# Patient Record
Sex: Male | Born: 1985 | Race: Black or African American | Hispanic: No | Marital: Married | Smoking: Former smoker
Health system: Southern US, Community
[De-identification: ages and names within clinical notes are randomized; demographics above are authoritative.]

## PROBLEM LIST (undated history)

## (undated) DIAGNOSIS — D649 Anemia, unspecified: Secondary | ICD-10-CM

## (undated) DIAGNOSIS — R6510 Systemic inflammatory response syndrome (SIRS) of non-infectious origin without acute organ dysfunction: Secondary | ICD-10-CM

## (undated) DIAGNOSIS — I313 Pericardial effusion (noninflammatory): Secondary | ICD-10-CM

## (undated) HISTORY — DX: Pericardial effusion (noninflammatory): I31.3

## (undated) HISTORY — DX: Anemia, unspecified: D64.9

## (undated) HISTORY — DX: Systemic inflammatory response syndrome (sirs) of non-infectious origin without acute organ dysfunction: R65.10

---

## 2017-11-22 ENCOUNTER — Ambulatory Visit (INDEPENDENT_AMBULATORY_CARE_PROVIDER_SITE_OTHER): Payer: Self-pay

## 2017-11-22 ENCOUNTER — Encounter (HOSPITAL_COMMUNITY): Payer: Self-pay | Admitting: Emergency Medicine

## 2017-11-22 ENCOUNTER — Other Ambulatory Visit: Payer: Self-pay

## 2017-11-22 ENCOUNTER — Observation Stay (HOSPITAL_COMMUNITY)
Admission: EM | Admit: 2017-11-22 | Discharge: 2017-11-23 | Disposition: A | Discharge status: Home / Self Care | Payer: Self-pay | Attending: Internal Medicine | Admitting: Internal Medicine

## 2017-11-22 ENCOUNTER — Emergency Department (HOSPITAL_COMMUNITY): Payer: Self-pay

## 2017-11-22 ENCOUNTER — Ambulatory Visit (HOSPITAL_COMMUNITY)
Admission: EM | Admit: 2017-11-22 | Discharge: 2017-11-22 | Disposition: A | Discharge status: Home / Self Care | Payer: Self-pay | Attending: Family Medicine | Admitting: Family Medicine

## 2017-11-22 ENCOUNTER — Encounter (HOSPITAL_COMMUNITY): Payer: Self-pay

## 2017-11-22 DIAGNOSIS — D649 Anemia, unspecified: Secondary | ICD-10-CM | POA: Diagnosis present

## 2017-11-22 DIAGNOSIS — R509 Fever, unspecified: Principal | ICD-10-CM

## 2017-11-22 DIAGNOSIS — I313 Pericardial effusion (noninflammatory): Secondary | ICD-10-CM | POA: Insufficient documentation

## 2017-11-22 DIAGNOSIS — I3139 Other pericardial effusion (noninflammatory): Secondary | ICD-10-CM

## 2017-11-22 DIAGNOSIS — F17200 Nicotine dependence, unspecified, uncomplicated: Secondary | ICD-10-CM | POA: Insufficient documentation

## 2017-11-22 DIAGNOSIS — M791 Myalgia, unspecified site: Secondary | ICD-10-CM

## 2017-11-22 DIAGNOSIS — R6510 Systemic inflammatory response syndrome (SIRS) of non-infectious origin without acute organ dysfunction: Secondary | ICD-10-CM | POA: Diagnosis present

## 2017-11-22 DIAGNOSIS — R0789 Other chest pain: Secondary | ICD-10-CM

## 2017-11-22 DIAGNOSIS — I300 Acute nonspecific idiopathic pericarditis: Secondary | ICD-10-CM | POA: Insufficient documentation

## 2017-11-22 LAB — CBC
HCT: 34.9 % — ABNORMAL LOW (ref 39.0–52.0)
HCT: 33.7 % — ABNORMAL LOW (ref 39.0–52.0)
Hemoglobin: 11.7 g/dL — ABNORMAL LOW (ref 13.0–17.0)
Hemoglobin: 11.3 g/dL — ABNORMAL LOW (ref 13.0–17.0)
MCH: 30.2 pg (ref 26.0–34.0)
MCH: 30.1 pg (ref 26.0–34.0)
MCHC: 33.5 g/dL (ref 30.0–36.0)
MCHC: 33.5 g/dL (ref 30.0–36.0)
MCV: 90.2 fL (ref 78.0–100.0)
MCV: 89.9 fL (ref 78.0–100.0)
PLATELETS: 287 10*3/uL (ref 150–400)
PLATELETS: 291 10*3/uL (ref 150–400)
RBC: 3.87 MIL/uL — ABNORMAL LOW (ref 4.22–5.81)
RBC: 3.75 MIL/uL — ABNORMAL LOW (ref 4.22–5.81)
RDW: 13.2 % (ref 11.5–15.5)
RDW: 13.2 % (ref 11.5–15.5)
WBC: 16.4 10*3/uL — AB (ref 4.0–10.5)
WBC: 15.1 10*3/uL — ABNORMAL HIGH (ref 4.0–10.5)

## 2017-11-22 LAB — BASIC METABOLIC PANEL
Anion gap: 12 (ref 5–15)
BUN: 8 mg/dL (ref 6–20)
CALCIUM: 9 mg/dL (ref 8.9–10.3)
CO2: 23 mmol/L (ref 22–32)
CREATININE: 1.08 mg/dL (ref 0.61–1.24)
Chloride: 97 mmol/L — ABNORMAL LOW (ref 98–111)
GFR calc Af Amer: >60 mL/min (ref >60)
GLUCOSE: 94 mg/dL (ref 70–99)
Potassium: 4 mmol/L (ref 3.5–5.1)
Sodium: 132 mmol/L — ABNORMAL LOW (ref 135–145)

## 2017-11-22 LAB — I-STAT TROPONIN, ED: TROPONIN I, POC: 0.01 ng/mL (ref 0.00–0.08)

## 2017-11-22 LAB — INFLUENZA PANEL BY PCR (TYPE A & B)
INFLBPCR: NEGATIVE
Influenza A By PCR: NEGATIVE

## 2017-11-22 LAB — POCT URINALYSIS DIP (DEVICE)
Glucose, UA: NEGATIVE mg/dL
Ketones, ur: 15 mg/dL — AB
Leukocytes, UA: NEGATIVE
NITRITE: NEGATIVE
PH: 6 (ref 5.0–8.0)
Protein, ur: 100 mg/dL — AB
Specific Gravity, Urine: 1.025 (ref 1.005–1.030)
UROBILINOGEN UA: 4 mg/dL — AB (ref 0.0–1.0)

## 2017-11-22 LAB — I-STAT CG4 LACTIC ACID, ED: Lactic Acid, Venous: 1.39 mmol/L (ref 0.5–1.9)

## 2017-11-22 LAB — TROPONIN I: Troponin I: <0.03 ng/mL (ref <0.03)

## 2017-11-22 LAB — POCT RAPID STREP A: STREPTOCOCCUS, GROUP A SCREEN (DIRECT): NEGATIVE

## 2017-11-22 MED ORDER — ACETAMINOPHEN 325 MG PO TABS
ORAL_TABLET | ORAL | Status: AC
  Filled 2017-11-22: qty 3

## 2017-11-22 MED ORDER — COLCHICINE 0.6 MG PO TABS
1.2000 mg | ORAL_TABLET | Freq: Once | ORAL | Status: AC
  Administered 2017-11-23: 1.2 mg via ORAL
  Filled 2017-11-22: qty 2

## 2017-11-22 MED ORDER — ACETAMINOPHEN 325 MG PO TABS
975.0000 mg | ORAL_TABLET | Freq: Once | ORAL | Status: AC
  Administered 2017-11-22: 975 mg via ORAL

## 2017-11-22 MED ORDER — METHYLPREDNISOLONE SODIUM SUCC 125 MG IJ SOLR: 80.0000 mg | Freq: Once | INTRAMUSCULAR | Status: DC

## 2017-11-22 MED ORDER — SODIUM CHLORIDE 0.9 % IV BOLUS
1000.0000 mL | Freq: Once | INTRAVENOUS | Status: AC
  Administered 2017-11-22: 1000 mL via INTRAVENOUS

## 2017-11-22 MED ORDER — HYDROCODONE-ACETAMINOPHEN 5-325 MG PO TABS
2.0000 | ORAL_TABLET | Freq: Once | ORAL | Status: AC
  Administered 2017-11-22: 2 via ORAL
  Filled 2017-11-22: qty 2

## 2017-11-22 MED ORDER — IBUPROFEN 800 MG PO TABS
800.0000 mg | ORAL_TABLET | Freq: Once | ORAL | Status: AC
  Administered 2017-11-22: 800 mg via ORAL
  Filled 2017-11-22: qty 1

## 2017-11-22 MED ORDER — IOPAMIDOL (ISOVUE-370) INJECTION 76%
100.0000 mL | Freq: Once | INTRAVENOUS | Status: AC | PRN
  Administered 2017-11-22: 100 mL via INTRAVENOUS

## 2017-11-22 MED ORDER — IOPAMIDOL (ISOVUE-370) INJECTION 76%
INTRAVENOUS | Status: AC
  Filled 2017-11-22: qty 100

## 2017-11-22 MED ORDER — METHYLPREDNISOLONE SODIUM SUCC 125 MG IJ SOLR
INTRAMUSCULAR | Status: AC
  Filled 2017-11-22: qty 2

## 2017-11-22 NOTE — ED Triage Notes (Signed)
Pt may have food poisoning per wife.

## 2017-11-22 NOTE — Discharge Instructions (Addendum)
No cause of infection identified, WBC elevated   Please go to emergency room for further workup

## 2017-11-22 NOTE — ED Provider Notes (Addendum)
[facility]    CSN: [id number] Arrival date & time: 11/22/17  1303     History   Chief Complaint Chief Complaint  Patient presents with  . Fever    HPI Jon Doyle is a 32 y.o. male no significant past medical history presenting today for evaluation of fever.  Patient has had a fever since Monday, for the past 4 days.  He has had associated symptoms of chills and body aches.  No other prominent symptoms.  He has had some discomfort in his chest, but no significant cough, sore throat, congestion or rhinorrhea.  He has a slight discomfort in his throat.  Denies any nausea, vomiting or diarrhea.  Denies abdominal pain.  Denies urinary symptoms of dysuria, increased frequency or urgency.  Does have some neck discomfort, but denies difficulty moving neck.  Wife concerned about possible Listeria as patient had [employer] on Friday and ate leftovers on Saturday and his symptoms started the next day.  HPI  History reviewed. No pertinent past medical history.  There are no active problems to display for this patient.   History reviewed. No pertinent surgical history.     Home Medications    Prior to Admission medications   Not on File    Family History History reviewed. No pertinent family history.  Social History Social History   Tobacco Use  . Smoking status: Not on file  Substance Use Topics  . Alcohol use: Not on file  . Drug use: Not on file     Allergies   Patient has no known allergies.   Review of Systems Review of Systems  Constitutional: Positive for activity change, appetite change and fever. Negative for fatigue.  HENT: Negative for congestion, sinus pressure and sore throat.   Eyes: Negative for photophobia, pain and visual disturbance.  Respiratory: Negative for cough and shortness of breath.   Cardiovascular: Negative for chest pain.  Gastrointestinal: Negative for abdominal pain, nausea and vomiting.  Genitourinary: Negative for  decreased urine volume and hematuria.  Musculoskeletal: Positive for myalgias and neck pain. Negative for neck stiffness.  Neurological: Positive for headaches. Negative for dizziness, syncope, facial asymmetry, speech difficulty, weakness, light-headedness and numbness.     Physical Exam Triage Vital Signs ED Triage Vitals  Enc Vitals Group     BP 11/22/17 1321 124/84     Pulse Rate 11/22/17 1321 (!) 119     Resp 11/22/17 1321 20     Temp 11/22/17 1320 (!) 101 F (38.3 C)     Temp Source 11/22/17 1320 Oral     SpO2 11/22/17 1321 98 %     Weight --      Height --      Head Circumference --      Peak Flow --      Pain Score --      Pain Loc --      Pain Edu? --      Excl. in GC? --    No data found.  Updated Vital Signs BP 124/84 (BP Location: Right Arm)   Pulse (!) 119   Temp (!) 101 F (38.3 C) (Oral)   Resp 20   SpO2 98%   Visual Acuity Right Eye Distance:   Left Eye Distance:   Bilateral Distance:    Right Eye Near:   Left Eye Near:    Bilateral Near:     Physical Exam  Constitutional: He appears well-developed and well-nourished.  HENT:  Head: Normocephalic and  atraumatic.  Bilateral ears without tenderness to palpation of external auricle, tragus and mastoid, EAC's without erythema or swelling, TM's with good bony landmarks and cone of light. Non erythematous.  Oral mucosa pink and moist, mild tonsillar enlargement or exudate. Posterior pharynx patent and erythematous, no uvula deviation or swelling. Normal phonation.  Eyes: Pupils are equal, round, and reactive to light. Conjunctivae and EOM are normal.  Neck: Neck supple.  Full active range of motion of neck, negative Brudzinski and Kernig  Cardiovascular: Regular rhythm.  No murmur heard. Tachycardic  Pulmonary/Chest: Effort normal and breath sounds normal. No respiratory distress.  Breathing comfortably at rest, CTABL, no wheezing, rales or other adventitious sounds auscultated  Abdominal: Soft.  There is no tenderness.  Nontender to light deep palpation throughout all 4 quadrants and epigastrium  Musculoskeletal: He exhibits no edema.  Nontender to palpation along the cervical, thoracic and lumbar spine midline  Neurological: He is alert.  Skin: Skin is warm and dry.  Psychiatric: He has a normal mood and affect.  Nursing note and vitals reviewed.    UC Treatments / Results  Labs (all labs ordered are listed, but only abnormal results are displayed) Labs Reviewed  CULTURE, GROUP A STREP [facility])  CBC    EKG None  Radiology Dg Chest 2 View  Result Date: 11/22/2017 CLINICAL DATA:  Fever of unknown origin for 4 days, body aches, smoking history EXAM: CHEST - 2 VIEW COMPARISON:  None. FINDINGS: No active infiltrate or effusion is seen. Mediastinal and hilar contours are unremarkable. The heart is within upper limits of normal. No acute bony abnormality is seen. Left nipple piercing is present. IMPRESSION: No active cardiopulmonary disease. Electronically Signed   By: Dwyane Dee M.D.   On: 11/22/2017 13:56    Procedures Procedures (including critical care time)  Medications Ordered in UC Medications - No data to display  Initial Impression / Assessment and Plan / UC Course  I have reviewed the triage vital signs and the nursing notes.  Pertinent labs & imaging results that were available during my care of the patient were reviewed by me and considered in my medical decision making (see chart for details).     Patient with fever of unknown origin, immunocompetent.  Fever and tachycardic.  Chest x-ray negative.  Strep negative.  UA negative for signs of infection, did show signs of dehydration which correlates with color of urine and tachycardia.  Possible viral cause, but CBC showing white blood cells of 16.4.  Will have patient go to emergency room for further work-up of fever given elevation.  Patient and wife verbalized understanding.  Patient stable upon discharge, wife  accompanying patient to emergency room. Final Clinical Impressions(s) / UC Diagnoses   Final diagnoses:  None   Discharge Instructions   None    ED Prescriptions    None     Controlled Substance Prescriptions [place] Controlled Substance Registry consulted? Not Applicable   Lew Dawes, PA-C 11/22/17 1452    Lew Dawes, [place] 11/22/17 1459

## 2017-11-22 NOTE — ED Notes (Signed)
Bed: UC01 Expected date: 11/22/17 Expected time:  Means of arrival:  Comments: For APPTS

## 2017-11-22 NOTE — ED Triage Notes (Signed)
Pt presents with ongoing fever that is making him weak.

## 2017-11-22 NOTE — ED Triage Notes (Signed)
Pt presents with fever x 2 days with CP that radiates to bilat shoulders and back; [facility] for fever; pts WBC was 16.4, UA neg, Strep neg, CXR neg

## 2017-11-22 NOTE — ED Provider Notes (Signed)
[facility] EMERGENCY DEPARTMENT Provider Note   CSN: [id number] Arrival date & time: 11/22/17  1500     History   Chief Complaint Chief Complaint  Patient presents with  . Fever  . Chest Pain  . Back Pain    HPI Jon Doyle is a 32 y.o. male.  He has no significant past medical history.  He is complaining of fever since Monday to a T-max of 102.9.  It does not seem to be responsive to ibuprofen.  He is also complained of some upper chest discomfort that radiates into his bilateral shoulders and upper back.  This is worsened with any deep breath.  There is been no cough no runny nose no sore throat no earache no nausea no vomiting no diarrhea.  No urinary symptoms no rashes.  No headache or neck pain.  No sick contacts no recent travel.  His wife is with him and she is not sick with anything.  He has been seen at urgent care where they had a white count of 16- urinalysis strep negative chest x-ray negative and sent him here for further evaluation.  Patient denies any IV drug use.  The history is provided by the patient.  Fever   The current episode started more than 2 days ago. The problem occurs constantly. The problem has not changed since onset.The maximum temperature noted was 102 to 102.9 F. Associated symptoms include chest pain and muscle aches. Pertinent negatives include no diarrhea, no vomiting, no congestion, no headaches, no sore throat and no cough. He has tried ibuprofen for the symptoms. The treatment provided mild relief.  Chest Pain   Associated symptoms include back pain, diaphoresis and a fever. Pertinent negatives include no abdominal pain, no cough, no headaches, no nausea, no shortness of breath and no vomiting.  Back Pain   Associated symptoms include chest pain and a fever. Pertinent negatives include no headaches, no abdominal pain and no dysuria.    History reviewed. No pertinent past medical history.  There are no active problems to  display for this patient.   History reviewed. No pertinent surgical history.      Home Medications    Prior to Admission medications   Not on File    Family History History reviewed. No pertinent family history.  Social History Social History   Tobacco Use  . Smoking status: Current Every Day Smoker  . Tobacco comment: black and mild  Substance Use Topics  . Alcohol use: Yes    Comment: social   . Drug use: Not Currently    Types: Marijuana    Comment: occasionally     Allergies   Patient has no known allergies.   Review of Systems Review of Systems  Constitutional: Positive for appetite change, diaphoresis and fever. Negative for chills.  HENT: Negative for congestion and sore throat.   Eyes: Negative for visual disturbance.  Respiratory: Negative for cough and shortness of breath.   Cardiovascular: Positive for chest pain. Negative for leg swelling.  Gastrointestinal: Negative for abdominal pain, diarrhea, nausea and vomiting.  Genitourinary: Negative for dysuria and genital sores.  Musculoskeletal: Positive for back pain. Negative for joint swelling, neck pain and neck stiffness.  Skin: Negative for rash and wound.  Neurological: Negative for headaches.     Physical Exam Updated Vital Signs BP (!) 141/87   Pulse (!) 106   Temp (!) 100.5 F (38.1 C) (Oral)   Resp 14   Ht 6' (1.829 m)  Wt 99.8 kg   SpO2 100%   BMI 29.84 kg/m   Physical Exam  Constitutional: He is oriented to person, place, and time. He appears well-developed and well-nourished.  HENT:  Head: Normocephalic and atraumatic.  Right Ear: Tympanic membrane normal.  Left Ear: Tympanic membrane normal.  Mouth/Throat: Uvula is midline, oropharynx is clear and moist and mucous membranes are normal.  Eyes: Conjunctivae are normal.  Neck: Full passive range of motion without pain. Neck supple. No Brudzinski's sign and no Kernig's sign noted.  Cardiovascular: Regular rhythm. Tachycardia  present.  No murmur heard. Pulmonary/Chest: Effort normal and breath sounds normal. No respiratory distress.  Abdominal: Soft. There is no tenderness.  Musculoskeletal: He exhibits no edema, tenderness or deformity.  Neurological: He is alert and oriented to person, place, and time. He has normal strength.  Skin: Skin is warm and dry.  Psychiatric: He has a normal mood and affect.  Nursing note and vitals reviewed.    ED Treatments / Results  Labs (all labs ordered are listed, but only abnormal results are displayed) Labs Reviewed  BASIC METABOLIC PANEL - Abnormal; Notable for the following components:      Result Value   Sodium 132 (*)    Chloride 97 (*)    All other components within normal limits  CBC - Abnormal; Notable for the following components:   WBC 15.1 (*)    RBC 3.75 (*)    Hemoglobin 11.3 (*)    HCT 33.7 (*)    All other components within normal limits  BASIC METABOLIC PANEL - Abnormal; Notable for the following components:   Calcium 8.3 (*)    All other components within normal limits  HEPATIC FUNCTION PANEL - Abnormal; Notable for the following components:   Albumin 2.8 (*)    All other components within normal limits  CBC WITH DIFFERENTIAL/PLATELET - Abnormal; Notable for the following components:   WBC 11.4 (*)    RBC 3.25 (*)    Hemoglobin 9.9 (*)    HCT 29.6 (*)    Monocytes Absolute 1.9 (*)    All other components within normal limits  SEDIMENTATION RATE - Abnormal; Notable for the following components:   Sed Rate 90 (*)    All other components within normal limits  C-REACTIVE PROTEIN - Abnormal; Notable for the following components:   CRP 22.6 (*)    All other components within normal limits  IRON AND TIBC - Abnormal; Notable for the following components:   Iron 9 (*)    TIBC 206 (*)    Saturation Ratios 4 (*)    All other components within normal limits  FERRITIN - Abnormal; Notable for the following components:   Ferritin 368 (*)    All  other components within normal limits  RETICULOCYTES - Abnormal; Notable for the following components:   RBC. 3.25 (*)    All other components within normal limits  URINALYSIS, ROUTINE W REFLEX MICROSCOPIC - Abnormal; Notable for the following components:   Hgb urine dipstick SMALL (*)    Ketones, ur 20 (*)    All other components within normal limits  CULTURE, BLOOD (ROUTINE X 2)  CULTURE, BLOOD (ROUTINE X 2)  MRSA PCR SCREENING  TROPONIN I  INFLUENZA PANEL BY PCR (TYPE A & B)  TSH  TROPONIN I  CK  VITAMIN B12  FOLATE  HIV ANTIBODY (ROUTINE TESTING W REFLEX)  TROPONIN I  TROPONIN I  ANA  I-STAT TROPONIN, ED  I-STAT CG4 LACTIC ACID, ED  EKG EKG Interpretation  Date/Time:  Thursday November 22 2017 16:06:32 EDT Ventricular Rate:  103 PR Interval:  134 QRS Duration: 86 QT Interval:  322 QTC Calculation: 421 R Axis:   88 Text Interpretation:  Sinus tachycardia Nonspecific T wave abnormality Abnormal ECG no prior to compare with Confirmed by Meridee ScoreButler, Owens Hara [phone]([id number]) on 11/22/2017 6:36:59 PM   Radiology Dg Chest 2 View  Result Date: 11/22/2017 CLINICAL DATA:  Fever of unknown origin for 4 days, body aches, smoking history EXAM: CHEST - 2 VIEW COMPARISON:  None. FINDINGS: No active infiltrate or effusion is seen. Mediastinal and hilar contours are unremarkable. The heart is within upper limits of normal. No acute bony abnormality is seen. Left nipple piercing is present. IMPRESSION: No active cardiopulmonary disease. Electronically Signed   By: Dwyane DeePaul  Barry M.D.   On: 11/22/2017 13:56   Ct Angio Chest Pe W/cm &/or Wo Cm  Result Date: 11/22/2017 CLINICAL DATA:  Shortness of breath. Chest pain and fever. Symptoms for 4 days. EXAM: CT ANGIOGRAPHY CHEST WITH CONTRAST TECHNIQUE: Multidetector CT imaging of the chest was performed using the standard protocol during bolus administration of intravenous contrast. Multiplanar CT image reconstructions and MIPs were obtained to evaluate  the vascular anatomy. CONTRAST:  100mL ISOVUE-370 IOPAMIDOL (ISOVUE-370) INJECTION 76% COMPARISON:  Radiograph earlier this day. FINDINGS: Cardiovascular: There are no filling defects within the pulmonary arteries to suggest pulmonary embolus. No aortic dissection or aneurysm. Moderately large circumferential pericardial effusion. This measures up to 2.1 cm in depth adjacent to the left ventricle. Fluid density higher than simple fluid. Mediastinum/Nodes: Small upper mediastinal lymph nodes all subcentimeter. No hilar adenopathy. Thyroid gland is normal. Esophagus is decompressed. Lungs/Pleura: No consolidation, pleural fluid or pulmonary edema. The trachea and mainstem bronchi are patent. Trace apical emphysema. Upper Abdomen: No acute abnormality. Musculoskeletal: There are no acute or suspicious osseous abnormalities. Ill-defined sclerotic density within T9 vertebral body is nonspecific but likely incidental. Minimal broad-based dextroscoliotic curvature of spine. Review of the MIP images confirms the above findings. IMPRESSION: 1. Moderate to large circumferential pericardial effusion. 2. No pulmonary embolus. 3. Mild emphysema, age advanced. Emphysema (ICD10-J43.9). Electronically Signed   By: Narda RutherfordMelanie  Sanford M.D.   On: 11/22/2017 22:39    Procedures Procedures (including critical care time)  Medications Ordered in ED Medications  sodium chloride 0.9 % bolus 1,000 mL (has no administration in time range)     Initial Impression / Assessment and Plan / ED Course  I have reviewed the triage vital signs and the nursing notes.  Pertinent labs & imaging results that were available during my care of the patient were reviewed by me and considered in my medical decision making (see chart for details).  Clinical Course as of Nov 24 1023  Thu Nov 22, 2017  [id number] 32 year old male with fever for 4 days, and the only localizing complaint is some upper chest discomfort radiating through the back with deep  breath.  He has an unremarkable exam.  He is got a white count of 15,000 and Artie had a negative chest x-ray.  Of added on blood cultures and troponin and if influenza panel.  Giving some IV fluids.   [MB]  1901 Cxr 9/12 - EXAM: CHEST - 2 VIEW  COMPARISON:  None.  FINDINGS: No active infiltrate or effusion is seen. Mediastinal and hilar contours are unremarkable. The heart is within upper limits of normal. No acute bony abnormality is seen. Left nipple piercing is present.  IMPRESSION: No active cardiopulmonary disease.    [  MB]  2328 Discussed with cardiology on-call C [facility].  He recommends high-dose NSAIDs or consideration for colchicine 1.2 mg.  He feels patient should be admitted to the hospital to have a formal cardiac echo tomorrow.  He said he would be available for patient further discussion tonight if there is any concerns.  Discussed with medicine hospitalist Dr. Linton Rump who will evaluate the patient in the ED.   [MB]    Clinical Course User Index [MB] Terrilee Files, MD     Final Clinical Impressions(s) / ED Diagnoses   Final diagnoses:  FUO (fever of unknown origin)  Pericardial effusion    ED Discharge Orders    None       Terrilee Files, MD 11/23/17 1026

## 2017-11-23 ENCOUNTER — Encounter (HOSPITAL_COMMUNITY): Payer: Self-pay | Admitting: Internal Medicine

## 2017-11-23 ENCOUNTER — Observation Stay (HOSPITAL_BASED_OUTPATIENT_CLINIC_OR_DEPARTMENT_OTHER): Payer: Self-pay

## 2017-11-23 DIAGNOSIS — D649 Anemia, unspecified: Secondary | ICD-10-CM

## 2017-11-23 DIAGNOSIS — I3139 Other pericardial effusion (noninflammatory): Secondary | ICD-10-CM | POA: Diagnosis present

## 2017-11-23 DIAGNOSIS — R6510 Systemic inflammatory response syndrome (SIRS) of non-infectious origin without acute organ dysfunction: Secondary | ICD-10-CM | POA: Diagnosis present

## 2017-11-23 DIAGNOSIS — I313 Pericardial effusion (noninflammatory): Secondary | ICD-10-CM

## 2017-11-23 HISTORY — DX: Other pericardial effusion (noninflammatory): I31.39

## 2017-11-23 HISTORY — DX: Anemia, unspecified: D64.9

## 2017-11-23 HISTORY — DX: Pericardial effusion (noninflammatory): I31.3

## 2017-11-23 HISTORY — DX: Systemic inflammatory response syndrome (sirs) of non-infectious origin without acute organ dysfunction: R65.10

## 2017-11-23 LAB — FERRITIN: FERRITIN: 368 ng/mL — AB (ref 24–336)

## 2017-11-23 LAB — CBC WITH DIFFERENTIAL/PLATELET
Abs Immature Granulocytes: 0 10*3/uL (ref 0.0–0.1)
BASOS ABS: 0 10*3/uL (ref 0.0–0.1)
BASOS PCT: 0 %
EOS PCT: 1 %
Eosinophils Absolute: 0.1 10*3/uL (ref 0.0–0.7)
HCT: 29.6 % — ABNORMAL LOW (ref 39.0–52.0)
Hemoglobin: 9.9 g/dL — ABNORMAL LOW (ref 13.0–17.0)
Immature Granulocytes: 0 %
Lymphocytes Relative: 20 %
Lymphs Abs: 2.2 10*3/uL (ref 0.7–4.0)
MCH: 30.5 pg (ref 26.0–34.0)
MCHC: 33.4 g/dL (ref 30.0–36.0)
MCV: 91.1 fL (ref 78.0–100.0)
MONO ABS: 1.9 10*3/uL — AB (ref 0.1–1.0)
Monocytes Relative: 17 %
Neutro Abs: 7 10*3/uL (ref 1.7–7.7)
Neutrophils Relative %: 62 %
PLATELETS: 261 10*3/uL (ref 150–400)
RBC: 3.25 MIL/uL — AB (ref 4.22–5.81)
RDW: 13.3 % (ref 11.5–15.5)
WBC: 11.4 10*3/uL — AB (ref 4.0–10.5)

## 2017-11-23 LAB — URINALYSIS, ROUTINE W REFLEX MICROSCOPIC
BACTERIA UA: NONE SEEN
Bilirubin Urine: NEGATIVE
GLUCOSE, UA: NEGATIVE mg/dL
Ketones, ur: 20 mg/dL — AB
Leukocytes, UA: NEGATIVE
Nitrite: NEGATIVE
PROTEIN: NEGATIVE mg/dL
SPECIFIC GRAVITY, URINE: 1.016 (ref 1.005–1.030)
pH: 6 (ref 5.0–8.0)

## 2017-11-23 LAB — HEPATIC FUNCTION PANEL
ALT: 29 U/L (ref 0–44)
AST: 20 U/L (ref 15–41)
Albumin: 2.8 g/dL — ABNORMAL LOW (ref 3.5–5.0)
Alkaline Phosphatase: 78 U/L (ref 38–126)
BILIRUBIN DIRECT: 0.2 mg/dL (ref 0.0–0.2)
BILIRUBIN INDIRECT: 0.4 mg/dL (ref 0.3–0.9)
TOTAL PROTEIN: 6.5 g/dL (ref 6.5–8.1)
Total Bilirubin: 0.6 mg/dL (ref 0.3–1.2)

## 2017-11-23 LAB — IRON AND TIBC
Iron: 9 ug/dL — ABNORMAL LOW (ref 45–182)
SATURATION RATIOS: 4 % — AB (ref 17.9–39.5)
TIBC: 206 ug/dL — ABNORMAL LOW (ref 250–450)
UIBC: 197 ug/dL

## 2017-11-23 LAB — BASIC METABOLIC PANEL
Anion gap: 9 (ref 5–15)
BUN: 7 mg/dL (ref 6–20)
CHLORIDE: 104 mmol/L (ref 98–111)
CO2: 23 mmol/L (ref 22–32)
Calcium: 8.3 mg/dL — ABNORMAL LOW (ref 8.9–10.3)
Creatinine, Ser: 1.1 mg/dL (ref 0.61–1.24)
GFR calc Af Amer: >60 mL/min (ref >60)
GLUCOSE: 91 mg/dL (ref 70–99)
POTASSIUM: 3.9 mmol/L (ref 3.5–5.1)
Sodium: 136 mmol/L (ref 135–145)

## 2017-11-23 LAB — TSH: TSH: 1.504 u[IU]/mL (ref 0.350–4.500)

## 2017-11-23 LAB — TROPONIN I: Troponin I: <0.03 ng/mL (ref <0.03)

## 2017-11-23 LAB — HIV ANTIBODY (ROUTINE TESTING W REFLEX): HIV SCREEN 4TH GENERATION: NONREACTIVE

## 2017-11-23 LAB — RETICULOCYTES
RBC.: 3.25 MIL/uL — AB (ref 4.22–5.81)
RETIC CT PCT: 0.6 % (ref 0.4–3.1)
Retic Count, Absolute: 19.5 10*3/uL (ref 19.0–186.0)

## 2017-11-23 LAB — FOLATE: Folate: 10.2 ng/mL (ref >5.9)

## 2017-11-23 LAB — CK: Total CK: 58 U/L (ref 49–397)

## 2017-11-23 LAB — ECHOCARDIOGRAM COMPLETE
Height: 72 in
Weight: 3612.02 oz

## 2017-11-23 LAB — MRSA PCR SCREENING: MRSA by PCR: NEGATIVE

## 2017-11-23 LAB — VITAMIN B12: Vitamin B-12: 255 pg/mL (ref 180–914)

## 2017-11-23 LAB — C-REACTIVE PROTEIN: CRP: 22.6 mg/dL — ABNORMAL HIGH (ref <1.0)

## 2017-11-23 LAB — SEDIMENTATION RATE: SED RATE: 90 mm/h — AB (ref 0–16)

## 2017-11-23 MED ORDER — COLCHICINE 0.6 MG PO TABS
0.6000 mg | ORAL_TABLET | Freq: Every day | ORAL | Status: DC
  Administered 2017-11-23: 0.6 mg via ORAL
  Filled 2017-11-23: qty 1

## 2017-11-23 MED ORDER — PANTOPRAZOLE SODIUM 40 MG PO TBEC
40.0000 mg | DELAYED_RELEASE_TABLET | Freq: Every day | ORAL | Status: DC
  Administered 2017-11-23: 40 mg via ORAL
  Filled 2017-11-23: qty 1

## 2017-11-23 MED ORDER — ACETAMINOPHEN 325 MG PO TABS: 650.0000 mg | ORAL_TABLET | Freq: Four times a day (QID) | ORAL | Status: DC | PRN

## 2017-11-23 MED ORDER — PANTOPRAZOLE SODIUM 40 MG PO TBEC: 40.0000 mg | DELAYED_RELEASE_TABLET | Freq: Every day | ORAL | 0 refills | Status: DC

## 2017-11-23 MED ORDER — HYDROCODONE-ACETAMINOPHEN 5-325 MG PO TABS: 1.0000 | ORAL_TABLET | ORAL | 0 refills | Status: DC | PRN

## 2017-11-23 MED ORDER — SODIUM CHLORIDE 0.9 % IV SOLN
INTRAVENOUS | Status: DC
  Administered 2017-11-23 (×2): via INTRAVENOUS

## 2017-11-23 MED ORDER — ONDANSETRON HCL 4 MG/2ML IJ SOLN: 4.0000 mg | Freq: Four times a day (QID) | INTRAMUSCULAR | Status: DC | PRN

## 2017-11-23 MED ORDER — IBUPROFEN 800 MG PO TABS: 800.0000 mg | ORAL_TABLET | Freq: Three times a day (TID) | ORAL | 0 refills | Status: DC

## 2017-11-23 MED ORDER — TRAMADOL HCL 50 MG PO TABS: 50.0000 mg | ORAL_TABLET | Freq: Four times a day (QID) | ORAL | Status: DC | PRN

## 2017-11-23 MED ORDER — HYDROCODONE-ACETAMINOPHEN 5-325 MG PO TABS: 2.0000 | ORAL_TABLET | ORAL | Status: DC | PRN

## 2017-11-23 MED ORDER — ONDANSETRON HCL 4 MG PO TABS: 4.0000 mg | ORAL_TABLET | Freq: Four times a day (QID) | ORAL | Status: DC | PRN

## 2017-11-23 MED ORDER — ACETAMINOPHEN 650 MG RE SUPP: 650.0000 mg | Freq: Four times a day (QID) | RECTAL | Status: DC | PRN

## 2017-11-23 MED ORDER — ACETAMINOPHEN 325 MG PO TABS
650.0000 mg | ORAL_TABLET | Freq: Four times a day (QID) | ORAL | Status: DC | PRN
  Administered 2017-11-23: 650 mg via ORAL
  Filled 2017-11-23: qty 2

## 2017-11-23 MED ORDER — IBUPROFEN 400 MG PO TABS
800.0000 mg | ORAL_TABLET | Freq: Three times a day (TID) | ORAL | Status: DC
  Administered 2017-11-23 (×2): 800 mg via ORAL
  Filled 2017-11-23 (×2): qty 2

## 2017-11-23 MED ORDER — COLCHICINE 0.6 MG PO TABS: 0.6000 mg | ORAL_TABLET | Freq: Two times a day (BID) | ORAL | 2 refills | Status: AC

## 2017-11-23 NOTE — Discharge Summary (Signed)
DISCHARGE SUMMARY  Jon Doyle  MR#: [id number]  DOB:[birth year]  Date of Admission: 11/22/2017 Date of Discharge: 11/23/2017  Attending Physician:Jeffrey Silvestre Gunner, MD  Patient's NWG:NFAOZHY, No Pcp Per  Consults:  [facility]   Disposition: D/C home   Follow-up Appts: Follow-up Information    Berton Bon, NP Follow up on 12/04/2017.   Specialty:  Nurse Practitioner Why:  Please arrive 15 minutes early for your 9:00am post-hospital cardiology follow-up appointment Contact information: [address] [place] [address] [phone]        Primary Care Physician of your choice Follow up.   Why:  Obtain a PCP to follow up on your anemia in 1-2 months.           Tests Needing Follow-up: -Assess for smoking cessation -Assess for resolution of pericardial effusion/pericarditis  -Re-evaluation of anemia once acute/subacute infection is resolved fully   Discharge Diagnoses: SIRS Large pericardial effusion - Idiopathic pericarditis  Normocytic normochromic anemia Tobacco abuse Possible Trace Apical Emphysema  Initial presentation: 32 y.o.malewithhistory of tobacco abuse who presented to the ER with fever and chills for 4-5 days w/ associated generalized body aches and pleuritic chest pain.   In the ER CT angio of the chest which showed a large pericardial effusion. Patient's BP was normal with no tachycardia. Cardiology was called and advised a 2D echo and NSAIDs + colchicine.   Hospital Course: The patient was admitted to the acute units with clinical and the above noted testing results consistent with pericarditis with a pericardial effusion.  There was no clinical evidence of tamponade.  Transthoracic echocardiogram was accomplished.  Cardiology consultation was accomplished.  Fortunately the patient's TTE did not reveal a significant sized effusion and confirmed no tamponade/pre-tamponade physiology.  The patient was already improving  with scheduled high-dose nonsteroidal and colchicine.  The exact cause of his pericarditis is not entirely clear but there were some symptoms suggestive of a possible low-grade preceding viral illness.  He has been cleared for discharge to continue 2 weeks of nonsteroidal therapy as well as 3 months of colchicine therapy.  He is to follow-up with cardiology for reevaluation.  During this hospital stay the patient was also found to be anemic.  His anemia was normochromic normocytic.  An anemia panel was most consistent with an anemia of chronic disease with iron deficiency.  Of note his ferritin was elevated, related to his acute pericarditis likely.  This may simply be related to a subacute/smoldering infection but warrants further evaluation in the future.  Reassessment of his anemia/hemoglobin is indicated at such time that his pericarditis is fully resolved.  Additionally the patient was found to have early findings consistent with apical emphysema on the CT Angie of the chest.  He admitted to habitual tobacco abuse.  He has been counseled as to the results of his CT scan and the absolute need to discontinue tobacco abuse to prevent progression to chronic symptomatic lung disease.  Allergies as of 11/23/2017   No Known Allergies     Medication List    TAKE these medications   acetaminophen 325 MG tablet Commonly known as:  TYLENOL Take 2 tablets (650 mg total) by mouth every 6 (six) hours as needed for mild pain (or Fever >/= 101).   colchicine 0.6 MG tablet Take 1 tablet (0.6 mg total) by mouth 2 (two) times daily.   HYDROcodone-acetaminophen 5-325 MG tablet Commonly known as:  NORCO/VICODIN Take 1 tablet by mouth every 4 (four) hours as needed  for severe pain.   ibuprofen 800 MG tablet Commonly known as:  ADVIL,MOTRIN Take 1 tablet (800 mg total) by mouth 3 (three) times daily with meals. What changed:    medication strength  how much to take  when to take this  reasons to take  this   loratadine 10 MG tablet Commonly known as:  CLARITIN Take 10 mg by mouth daily as needed (for cold-like symptoms or allergies).   NYQUIL MULTI-SYMPTOM PO Take 30 mLs by mouth every 6 (six) hours as needed (for fever or cold-like synptoms).   pantoprazole 40 MG tablet Commonly known as:  PROTONIX Take 1 tablet (40 mg total) by mouth daily at 12 noon. Start taking on:  11/24/2017       Day of Discharge BP 116/78   Pulse 82   Temp 98.4 F (36.9 C) (Oral)   Resp 16   Ht 6' (1.829 m)   Wt 102.4 kg   SpO2 100%   BMI 30.62 kg/m   Physical Exam: General: No acute respiratory distress Lungs: Clear to auscultation bilaterally without wheezes or crackles Cardiovascular: Regular rate and rhythm - prominent pericardial friction rub  Abdomen: Nontender, nondistended, soft, bowel sounds positive, no rebound, no ascites, no appreciable mass Extremities: No significant cyanosis, clubbing, or edema bilateral lower extremities  Basic Metabolic Panel: Recent Labs  Lab 11/22/17 1615 11/23/17 0312  NA 132* 136  K 4.0 3.9  CL 97* 104  CO2 23 23  GLUCOSE 94 91  BUN 8 7  CREATININE 1.08 1.10  CALCIUM 9.0 8.3*    Liver Function Tests: Recent Labs  Lab 11/23/17 0312  AST 20  ALT 29  ALKPHOS 78  BILITOT 0.6  PROT 6.5  ALBUMIN 2.8*    CBC: Recent Labs  Lab 11/22/17 1351 11/22/17 1615 11/23/17 0312  WBC 16.4* 15.1* 11.4*  NEUTROABS  --   --  7.0  HGB 11.7* 11.3* 9.9*  HCT 34.9* 33.7* 29.6*  MCV 90.2 89.9 91.1  PLT 287 291 261    Cardiac Enzymes: Recent Labs  Lab 11/22/17 1902 11/23/17 0312 11/23/17 0910  CKTOTAL  --  58  --   TROPONINI <0.03 <0.03 <0.03    Recent Results (from the past 240 hour(s))  Culture, group A strep (throat)     Status: None (Preliminary result)   Collection Time: 11/22/17  1:41 PM  Result Value Ref Range Status   Specimen Description THROAT  Final   Special Requests NONE  Final   Culture   Final    CULTURE REINCUBATED FOR  BETTER GROWTH Performed at [facility], [address], [place], [place] [address]    Report Status PENDING  Incomplete  Culture, blood (routine x 2)     Status: None (Preliminary result)   Collection Time: 11/22/17  7:01 PM  Result Value Ref Range Status   Specimen Description BLOOD RIGHT ANTECUBITAL  Final   Special Requests   Final    BOTTLES DRAWN AEROBIC AND ANAEROBIC Blood Culture adequate volume   Culture   Final    NO GROWTH < 24 HOURS Performed at [facility], [address] [place], [place] [address]    Report Status PENDING  Incomplete  Culture, blood (routine x 2)     Status: None (Preliminary result)   Collection Time: 11/22/17  7:21 PM  Result Value Ref Range Status   Specimen Description BLOOD LEFT ANTECUBITAL  Final   Special Requests   Final  BOTTLES DRAWN AEROBIC AND ANAEROBIC Blood Culture results may not be optimal due to an excessive volume of blood received in culture bottles   Culture   Final    NO GROWTH < 24 HOURS Performed at [facility], [address] [place], [place] [address]    Report Status PENDING  Incomplete  MRSA PCR Screening     Status: None   Collection Time: 11/23/17  1:44 AM  Result Value Ref Range Status   MRSA by PCR NEGATIVE NEGATIVE Final    Comment:        The GeneXpert MRSA Assay (FDA approved for NASAL specimens only), is one component of a comprehensive MRSA colonization surveillance program. It is not intended to diagnose MRSA infection nor to guide or monitor treatment for MRSA infections. Performed at [facility], [address] [place], [place] [address]      Time spent in discharge (includes decision making & examination of pt): 30 minutes  11/23/2017, 4:08 PM   Lonia BloodJeffrey T. McClung, MD [facility] Office  [phone] Pager [phone]  On-Call/Text Page:      [email or web address]      password [email or web address]

## 2017-11-23 NOTE — Care Management Note (Signed)
Case Management Note Donn PieriniKristi Sherel Fennell RN, BSN Unit 4E- RN Care Coordinator  [phone]  Patient Details  Name: Maryruth EveJonathan Prettyman MRN: [id number] Date of Birth: 09/30/1985  Subjective/Objective:    Pt presented with SIRS                Action/Plan: PTA pt lived at home with wife, independent with ADLs. Notified by MD that pt would transition home today and would need assistance with medications and PCP needs as pt is new to area. Spoke with pt and wife at bedside- moved here from [place]- no PCP yet and no insurance (pt started new job and no insurance until Oct. 1). CM will assist pt with medications via MATCH for discharge. Pt/wife also familiar with Goodrx if needed prior to insurance starting. MATCH letter provided at bedside along with list of pharmacies to use. Program explained including one time use within 12 mo and copay cost per script of $3.00. Pt and wife voiced appreciation for assistance. Info also provided for low income clinics for PCP needs along with [facility] # for when pt gets his insurance info for PCP needs in this area. Pt and wife to f/u in finding a local PCP.   Expected Discharge Date:  11/23/17               Expected Discharge Plan:  Home/Self Care  In-House Referral:  NA  Discharge planning Services  CM Consult, Medication Assistance, MATCH Program, Indigent Health Clinic  Post Acute Care Choice:  NA Choice offered to:  NA  DME Arranged:    DME Agency:     HH Arranged:    HH Agency:     Status of Service:  Completed, signed off  If discussed at [employer] Length of Stay Meetings, dates discussed:    Discharge Disposition: home/self care   Additional Comments:  Darrold SpanWebster, Rylei Codispoti Hall, RN 11/23/2017, 4:44 PM

## 2017-11-23 NOTE — Progress Notes (Signed)
2D Echocardiogram has been performed.  Pieter PartridgeBrooke S Rechy Bost 11/23/2017, 3:12 PM

## 2017-11-23 NOTE — H&P (Signed)
History and Physical    Jon Doyle ZOX:[id number] DOB: 1986/01/17 DOA: 11/22/2017  PCP: Patient, No Pcp Per  Patient coming from: Home.  Chief Complaint: Fever chills.  HPI: Jon Doyle is a 32 y.o. male with history of tobacco abuse presents to the ER with complaint of fever and chills over the last 4 to 5 days.  Patient has been having generalized body ache with some pleuritic type of chest pain.  Denies any productive cough nausea vomiting abdominal pain diarrhea dysuria.  Has not had any recent travel or sick contacts.  Has been taking over-the-counter medications for the flulike symptoms.  Patient had gone to urgent care center earlier yesterday and over the had strep throat and UA which were unremarkable and since patient had fever and leukocytosis and referred to the ER.  ED Course: In the ER patient had some chest pain concerning and underwent CT angiogram of the chest which showed large pericardial effusion.  Patient blood pressure was normal no tachycardia.  On-call cardiologist was consulted and at this time advised getting 2D echo and starting patient on NSAIDs and colchicine.  Blood cultures were obtained.  And patient admitted for further observation.  In addition patient's blood work show normocytic normochromic anemia but patient denies receiving any blood in the stool.  Review of Systems: As per HPI, rest all negative.   History reviewed. No pertinent past medical history.  History reviewed. No pertinent surgical history.   reports that he has been smoking. He has never used smokeless tobacco. He reports that he drinks alcohol. He reports that he has current or past drug history. Drug: Marijuana.  No Known Allergies  History reviewed. No pertinent family history.  Prior to Admission medications   Medication Sig Start Date End Date Taking? Authorizing Provider  ibuprofen (ADVIL,MOTRIN) 200 MG tablet Take 400 mg by mouth every 6 (six) hours as needed (for fever).    Yes [provider]  loratadine (CLARITIN) 10 MG tablet Take 10 mg by mouth daily as needed (for cold-like symptoms or allergies).   Yes [provider]  Pseudoeph-Doxylamine-DM-APAP (NYQUIL MULTI-SYMPTOM PO) Take 30 mLs by mouth every 6 (six) hours as needed (for fever or cold-like synptoms).    Yes [provider]    Physical Exam: Vitals:   11/23/17 0015 11/23/17 0030 11/23/17 0045 11/23/17 0111  BP:    113/74  Pulse: 89 87 85 84  Resp: 16 16 16 14   Temp:    98.4 F (36.9 C)  TempSrc:    Oral  SpO2: 95% 97% 97% 98%  Weight:    102.4 kg  Height:    6' (1.829 m)      Constitutional: Moderately built and nourished. Vitals:   11/23/17 0015 11/23/17 0030 11/23/17 0045 11/23/17 0111  BP:    113/74  Pulse: 89 87 85 84  Resp: 16 16 16 14   Temp:    98.4 F (36.9 C)  TempSrc:    Oral  SpO2: 95% 97% 97% 98%  Weight:    102.4 kg  Height:    6' (1.829 m)   Eyes: Anicteric no pallor. ENMT: No discharge from the ears eyes nose or mouth. Neck: No mass felt.  No neck rigidity. Respiratory: No rhonchi or crepitations. Cardiovascular: S1-S2 heard no murmurs appreciated. Abdomen: Soft nontender bowel sounds present. Musculoskeletal: No edema.  No joint effusion. Skin: No rash.  Skin appears warm. Neurologic: Alert awake oriented to time place and person.  Moves all extremities.  Psychiatric: Appears normal per normal affect.   Labs on Admission: I have personally reviewed following labs and imaging studies  CBC: Recent Labs  Lab 11/22/17 1351 11/22/17 1615  WBC 16.4* 15.1*  HGB 11.7* 11.3*  HCT 34.9* 33.7*  MCV 90.2 89.9  PLT 287 291   Basic Metabolic Panel: Recent Labs  Lab 11/22/17 1615  NA 132*  K 4.0  CL 97*  CO2 23  GLUCOSE 94  BUN 8  CREATININE 1.08  CALCIUM 9.0   GFR: Estimated Creatinine Clearance: 120.4 mL/min (by C-G formula based on SCr of 1.08 mg/dL). Liver Function Tests: No results for input(s): AST, ALT, ALKPHOS,  BILITOT, PROT, ALBUMIN in the last 168 hours. No results for input(s): LIPASE, AMYLASE in the last 168 hours. No results for input(s): AMMONIA in the last 168 hours. Coagulation Profile: No results for input(s): INR, PROTIME in the last 168 hours. Cardiac Enzymes: Recent Labs  Lab 11/22/17 1902  TROPONINI <0.03   BNP (last 3 results) No results for input(s): PROBNP in the last 8760 hours. HbA1C: No results for input(s): HGBA1C in the last 72 hours. CBG: No results for input(s): GLUCAP in the last 168 hours. Lipid Profile: No results for input(s): CHOL, HDL, LDLCALC, TRIG, CHOLHDL, LDLDIRECT in the last 72 hours. Thyroid Function Tests: No results for input(s): TSH, T4TOTAL, FREET4, T3FREE, THYROIDAB in the last 72 hours. Anemia Panel: No results for input(s): VITAMINB12, FOLATE, FERRITIN, TIBC, IRON, RETICCTPCT in the last 72 hours. Urine analysis:    Component Value Date/Time   LABSPEC 1.025 11/22/2017 1414   PHURINE 6.0 11/22/2017 1414   GLUCOSEU NEGATIVE 11/22/2017 1414   HGBUR TRACE (A) 11/22/2017 1414   BILIRUBINUR MODERATE (A) 11/22/2017 1414   KETONESUR 15 (A) 11/22/2017 1414   PROTEINUR 100 (A) 11/22/2017 1414   UROBILINOGEN 4.0 (H) 11/22/2017 1414   NITRITE NEGATIVE 11/22/2017 1414   LEUKOCYTESUR NEGATIVE 11/22/2017 1414   Sepsis Labs: @LABRCNTIP (procalcitonin:4,lacticidven:4) )No results found for this or any previous visit (from the past 240 hour(s)).   Radiological Exams on Admission: Dg Chest 2 View  Result Date: 11/22/2017 CLINICAL DATA:  Fever of unknown origin for 4 days, body aches, smoking history EXAM: CHEST - 2 VIEW COMPARISON:  None. FINDINGS: No active infiltrate or effusion is seen. Mediastinal and hilar contours are unremarkable. The heart is within upper limits of normal. No acute bony abnormality is seen. Left nipple piercing is present. IMPRESSION: No active cardiopulmonary disease. Electronically Signed   By: Dwyane Dee M.D.   On: 11/22/2017  13:56   Ct Angio Chest Pe W/cm &/or Wo Cm  Result Date: 11/22/2017 CLINICAL DATA:  Shortness of breath. Chest pain and fever. Symptoms for 4 days. EXAM: CT ANGIOGRAPHY CHEST WITH CONTRAST TECHNIQUE: Multidetector CT imaging of the chest was performed using the standard protocol during bolus administration of intravenous contrast. Multiplanar CT image reconstructions and MIPs were obtained to evaluate the vascular anatomy. CONTRAST:  ISOVUE-370 IOPAMIDOL (ISOVUE-370) INJECTION 76% COMPARISON:  Radiograph earlier this day. FINDINGS: Cardiovascular: There are no filling defects within the pulmonary arteries to suggest pulmonary embolus. No aortic dissection or aneurysm. Moderately large circumferential pericardial effusion. This measures up to 2.1 cm in depth adjacent to the left ventricle. Fluid density higher than simple fluid. Mediastinum/Nodes: Small upper mediastinal lymph nodes all subcentimeter. No hilar adenopathy. Thyroid gland is normal. Esophagus is decompressed. Lungs/Pleura: No consolidation, pleural fluid or pulmonary edema. The trachea and mainstem bronchi are patent. Trace apical emphysema. Upper Abdomen: No acute  abnormality. Musculoskeletal: There are no acute or suspicious osseous abnormalities. Ill-defined sclerotic density within T9 vertebral body is nonspecific but likely incidental. Minimal broad-based dextroscoliotic curvature of spine. Review of the MIP images confirms the above findings. IMPRESSION: 1. Moderate to large circumferential pericardial effusion. 2. No pulmonary embolus. 3. Mild emphysema, age advanced. Emphysema (ICD10-J43.9). Electronically Signed   By: Narda RutherfordMelanie  Sanford M.D.   On: 11/22/2017 22:39    EKG: Independently reviewed.  Sinus tachycardia.  Assessment/Plan Principal Problem:   SIRS (systemic inflammatory response syndrome) (HCC) Active Problems:   Pericardial effusion   Normocytic anemia    1. SIRS with large pericardial effusion hemodynamically  stable no clinical signs of tamponade at this time -cardiologist has recommended to start patient on NSAIDs and colchicine which has been started and discussed with pharmacy for further dosing.  Also keep patient on Protonix.  Will check blood cultures HIV status ANA sed rate CRP.  Check 2D echo get formal cardiology consult in the morning. 2. Normocytic normochromic anemia -check anemia panel follow CBC. 3. Emphysema seen in the CT scan.  Smoking cessation counseling requested.  Presently not wheezing.   DVT prophylaxis: SCDs since patient has large pericardial effusion. Code Status: Full code. Family Communication: Discussed with patient's wife. Disposition Plan: Home. Consults called: Cardiology. Admission status: Observation.   Eduard ClosArshad N Nishika Parkhurst MD [facility] Pager [phone].  If 7PM-7AM, please contact night-coverage [email or web address] Password [email or web address]  11/23/2017, 2:20 AM

## 2017-11-23 NOTE — Consult Note (Addendum)
Cardiology Consultation:   Patient ID: Jon Doyle; [id number]; Sep 26, 1985   Admit date: 11/22/2017 Date of Consult: 11/23/2017  Primary Care Provider: Patient, No Pcp Per Primary Cardiologist: New to [place] Primary Electrophysiologist:  None   Patient Profile:   Jon Doyle is a 31 y.o. male with a PMH of tobacco abuse, who is being seen today for the evaluation of a pericardial effusion at the request of Dr. Thereasa Solo.  History of Present Illness:   Jon Doyle was in his usual state of health until Monday 11/19/17 when he began experiencing fevers and myalgias. He reported some pleuritic chest pain which was worse with laying down and improved with sitting upright. He states the pain would worsening with yawning, sneezing, or hiccupping. Prior to this he does not recall any specific URI symptoms but wife reports that he complained of a sore throat the week before fever onset. He had a decreased appetite and energy level since onset of fevers. He denied dysuria, nausea, vomiting, or diarrhea.   He denies prior cardiac history. No family history of heart disease in mother/father who are alive and healthy. He does reports aunts/uncles who have had MI's. No family history of autoimmune disease that he knows of. He does smoke, about 4-5 black & mild's per day. He has never tried to quit in the past but is not opposed to quitting.   He is feeling much better at the time of this evaluation. He is able to lay in the bed comfortably with pain. Fevers have stopped. He feels like his appetite has picked up in the past 12 hours. Prior to this he reports being fairly active. He recently moved to [place] from [place] to be closer to his daughter who lives in [place]. He has not established care with a PCP at this time.   Hospital course: febrile to 100.7, tachycardic in the 100s,and intermittently mildly hypertensive/tachypneic, satting well on RA. Labs notable for electrolytes wnl,  Cr 1.1, albumin 2.8, WBC 15.1>11.4, Hgb 11.3>9.9, Iron/TIBC low, ferritin elevated, folate and B12 wnl, TSH wnl, ESR/CRP elevated, Trop negative x3, Influenza negative, Rapid strep negative, UA with moderate bilirubin, trace blood, and mild protein. HIV and blood cultures pending. CXR without acute findings. CTA Chest without PE but showed moderate to large circumferential pericardial effusion and mild emphysema. EKG with sinus tachycardia with non-specific T wave abnormalities, without STE/D. Patient was admitted to medicine and started on ibuprofen and colchicine for likely pericarditis. Cardiology asked to evaluate for pericardial effusion.   History reviewed. No pertinent past medical history.  History reviewed. No pertinent surgical history.   Home Medications:  Prior to Admission medications   Medication Sig Start Date End Date Taking? Authorizing Provider  ibuprofen (ADVIL,MOTRIN) 200 MG tablet Take 400 mg by mouth every 6 (six) hours as needed (for fever).   Yes [provider]  loratadine (CLARITIN) 10 MG tablet Take 10 mg by mouth daily as needed (for cold-like symptoms or allergies).   Yes [provider]  Pseudoeph-Doxylamine-DM-APAP (NYQUIL MULTI-SYMPTOM PO) Take 30 mLs by mouth every 6 (six) hours as needed (for fever or cold-like synptoms).    Yes [provider]    Inpatient Medications: Scheduled Meds: . colchicine  0.6 mg Oral Daily  . ibuprofen  800 mg Oral TID WC  . pantoprazole  40 mg Oral Q1200   Continuous Infusions: . sodium chloride 100 mL/hr at 11/23/17 1252   PRN Meds: acetaminophen **OR** acetaminophen, ondansetron **OR** ondansetron (ZOFRAN) IV  Allergies:   No Known Allergies  Social History:   Social History   Socioeconomic History  . Marital status: Married    Spouse name: Not on file  . Number of children: Not on file  . Years of education: Not on file  . Highest education level: Not on file  Occupational History  . Not  on file  Social Needs  . Financial resource strain: Not on file  . Food insecurity:    Worry: Not on file    Inability: Not on file  . Transportation needs:    Medical: Not on file    Non-medical: Not on file  Tobacco Use  . Smoking status: Current Every Day Smoker  . Smokeless tobacco: Never Used  . Tobacco comment: black and mild  Substance and Sexual Activity  . Alcohol use: Yes    Comment: social   . Drug use: Not Currently    Types: Marijuana    Comment: occasionally  . Sexual activity: Not on file  Lifestyle  . Physical activity:    Days per week: Not on file    Minutes per session: Not on file  . Stress: Not on file  Relationships  . Social connections:    Talks on phone: Not on file    Gets together: Not on file    Attends religious service: Not on file    Active member of club or organization: Not on file    Attends meetings of clubs or organizations: Not on file    Relationship status: Not on file  . Intimate partner violence:    Fear of current or ex partner: Not on file    Emotionally abused: Not on file    Physically abused: Not on file    Forced sexual activity: Not on file  Other Topics Concern  . Not on file  Social History Narrative  . Not on file    Family History:   No family history of heart disease in mother/father who are alive and healthy. He does reports aunts/uncles who have had MI's. No family history of autoimmune disease that he knows of.  ROS:  Please see the history of present illness.   All other ROS reviewed and negative.     Physical Exam/Data:   Vitals:   11/23/17 1157 11/23/17 1200 11/23/17 1300 11/23/17 1400  BP: 116/78 116/78    Pulse: 95 87 86 82  Resp: '20 20 16 16  ' Temp: 98.4 F (36.9 C)     TempSrc: Oral     SpO2: 95% 99% 99% 100%  Weight:      Height:        Intake/Output Summary (Last 24 hours) at 11/23/2017 1412 Last data filed at 11/23/2017 0830 Gross per 24 hour  Intake 2283.57 ml  Output -  Net 2283.57  ml   Filed Weights   11/22/17 1837 11/23/17 0111  Weight: 99.8 kg 102.4 kg   Body mass index is 30.62 kg/m.  General:  Well nourished, well developed, laying in bed in no acute distress HEENT: sclera anicteric  Neck: no JVD Vascular: No carotid bruits; distal pulses 2+ bilaterally Cardiac:  normal S1, S2; RRR; no murmurs or gallops; +rub Lungs:  clear to auscultation bilaterally, no wheezing, rhonchi or rales  Abd: NABS, soft, nontender, no hepatomegaly Ext: no edema Musculoskeletal:  No deformities, BUE and BLE strength normal and equal Skin: warm and dry  Neuro:  CNs 2-12 intact, no focal abnormalities noted Psych:  Normal affect  EKG:  The EKG was personally reviewed and demonstrates:  sinus tachycardia with non-specific T wave abnormalities, without STE/D.  Telemetry:  Telemetry was personally reviewed and demonstrates:  Sinus tachycardia > NSR  Relevant CV Studies: Echo 11/23/17: pending  Laboratory Data:  Chemistry Recent Labs  Lab 11/22/17 1615 11/23/17 0312  NA 132* 136  K 4.0 3.9  CL 97* 104  CO2 23 23  GLUCOSE 94 91  BUN 8 7  CREATININE 1.08 1.10  CALCIUM 9.0 8.3*  GFRNONAA >60 >60  GFRAA >60 >60  ANIONGAP 12 9    Recent Labs  Lab 11/23/17 0312  PROT 6.5  ALBUMIN 2.8*  AST 20  ALT 29  ALKPHOS 78  BILITOT 0.6   Hematology Recent Labs  Lab 11/22/17 1351 11/22/17 1615 11/23/17 0312  WBC 16.4* 15.1* 11.4*  RBC 3.87* 3.75* 3.25*  3.25*  HGB 11.7* 11.3* 9.9*  HCT 34.9* 33.7* 29.6*  MCV 90.2 89.9 91.1  MCH 30.2 30.1 30.5  MCHC 33.5 33.5 33.4  RDW 13.2 13.2 13.3  PLT 287 291 261   Cardiac Enzymes Recent Labs  Lab 11/22/17 1902 11/23/17 0312 11/23/17 0910  TROPONINI <0.03 <0.03 <0.03    Recent Labs  Lab 11/22/17 1633  TROPIPOC 0.01    BNPNo results for input(s): BNP, PROBNP in the last 168 hours.  DDimer No results for input(s): DDIMER in the last 168 hours.  Radiology/Studies:  Dg Chest 2 View  Result Date:  11/22/2017 CLINICAL DATA:  Fever of unknown origin for 4 days, body aches, smoking history EXAM: CHEST - 2 VIEW COMPARISON:  None. FINDINGS: No active infiltrate or effusion is seen. Mediastinal and hilar contours are unremarkable. The heart is within upper limits of normal. No acute bony abnormality is seen. Left nipple piercing is present. IMPRESSION: No active cardiopulmonary disease. Electronically Signed   By: Ivar Drape M.D.   On: 11/22/2017 13:56   Ct Angio Chest Pe W/cm &/or Wo Cm  Result Date: 11/22/2017 CLINICAL DATA:  Shortness of breath. Chest pain and fever. Symptoms for 4 days. EXAM: CT ANGIOGRAPHY CHEST WITH CONTRAST TECHNIQUE: Multidetector CT imaging of the chest was performed using the standard protocol during bolus administration of intravenous contrast. Multiplanar CT image reconstructions and MIPs were obtained to evaluate the vascular anatomy. CONTRAST:  169m ISOVUE-370 IOPAMIDOL (ISOVUE-370) INJECTION 76% COMPARISON:  Radiograph earlier this day. FINDINGS: Cardiovascular: There are no filling defects within the pulmonary arteries to suggest pulmonary embolus. No aortic dissection or aneurysm. Moderately large circumferential pericardial effusion. This measures up to 2.1 cm in depth adjacent to the left ventricle. Fluid density higher than simple fluid. Mediastinum/Nodes: Small upper mediastinal lymph nodes all subcentimeter. No hilar adenopathy. Thyroid gland is normal. Esophagus is decompressed. Lungs/Pleura: No consolidation, pleural fluid or pulmonary edema. The trachea and mainstem bronchi are patent. Trace apical emphysema. Upper Abdomen: No acute abnormality. Musculoskeletal: There are no acute or suspicious osseous abnormalities. Ill-defined sclerotic density within T9 vertebral body is nonspecific but likely incidental. Minimal broad-based dextroscoliotic curvature of spine. Review of the MIP images confirms the above findings. IMPRESSION: 1. Moderate to large circumferential  pericardial effusion. 2. No pulmonary embolus. 3. Mild emphysema, age advanced. Emphysema (ICD10-J43.9). Electronically Signed   By: MKeith RakeM.D.   On: 11/22/2017 22:39    Assessment and Plan:   1. Pericarditis c/b pericardial effusion: patient presented with fever of unknown origin. Found to have a moderate to large pericardial effusion of CTA Chest. Symptoms sound consistent with pericarditis but  no clear inciting incident aside from possible sore throat one week prior to fever onset. No evidence of tamponade. Trops negative. EKG without ischemic changes. Echo pending. ESR/CRP are elevated. Patient was started on ibuprofen and colchicine with improvement in symptoms.  - Would continue ibuprofen x2 weeks - Plan for colchicine x3 months - Would benefit from PPI while on NSAIDs  - Will plan to see him in our office in 2 weeks to monitor for symptom improvement  2. Tobacco abuse: smokes 4-5 black&milds per day. Discussed evidence of emphysema on CTA Chest and benefits of smoking cessation. - Continue to encourage smoking cessation  3. Anemia: Hgb 11.7>9.9 - likely dilutional; anemia work-up with iron deficiency.  - Will defer management to primary team.    For questions or updates, please contact [facility] Please consult [email or web address] for contact info under Cardiology/STEMI.   Signed, Abigail Butts, PA-C  11/23/2017 2:12 PM [phone]  I have examined the patient and reviewed assessment and plan and discussed with patient.  Agree with above as stated.  Exam reveals 3 component friction rub.  No change in radial pulse Sx worse with lying down. Classic pericarditis sx.  ECG not typical.  I personally reviewed the echo.  THe effusion is mostly posterior and is small.  No evidence of tamponade.    Will treat with NSAID and colchicine.  F/u in a few weeks in our office. He needs to stop smoking.    Larae Grooms

## 2017-11-23 NOTE — Discharge Instructions (Signed)
Pericarditis Pericarditis is swelling and irritation (inflammation) of your pericardium. The pericardium is a thin, double-layered, fluid-filled sac that surrounds your heart. The pericardium protects and holds your heart in your chest cavity. Inflammation of your pericardium can cause rubbing (friction) between the two layers when your heart beats. Fluid may build up between the layers of the sac (pericardial effusion). Different types of pericarditis include:  Acute pericarditis. Inflammation develops suddenly and causes pericardial effusion.  Chronic pericarditis. Inflammation may develop gradually, or it may continue after acute pericarditis and last longer than 6 months.  Constrictive pericarditis. The layers of the pericardium stiffen and develop scar tissue. The scar tissue thickens and sticks together. This makes it difficult for the heart to pump and to work as it normally does. This type is rare.  In most cases, pericarditis is acute and not serious. Chronic pericarditis and constrictive pericarditis may be more serious and may require treatment. What are the causes? Often, the cause of pericarditis is not known.If a cause is found, the cause may be:  A viral infection.  A heart attack (myocardial infarction).  Open-heart surgery (coronary artery bypass graft surgery).  Chest injury.  Autoimmune conditions, such as lupus or rheumatoid arthritis.  Kidney failure.  Low-functioning thyroid gland (hypothyroidism).  Cancer from another part of the body that has spread (metastasized) to the pericardium.  Radiation treatment.  Certain medicines, including some seizure medicines, blood thinners, heart medicines, and antibiotics.  A bacterial or fungal infection. This cause is less common.  What increases the risk? The following factors may increase your risk of pericarditis:  Being male.  Being 20-50 years old.  Having had pericarditis before.  Having had a recent  upper respiratory tract infection.  What are the signs or symptoms? The most common symptom of pericarditis is chest pain. This pain may:  Be in the center of your chest or the left side of your chest.  Not go away with rest.  Last for many hours or days.  Worsen when you lie down and go away when you sit up and lean forward.  Worsen when you swallow.  Move to your back, neck, or shoulder.  Other symptoms may include:  A chronic, dry cough.  Heart palpitations. These may feel like rapid, fluttering, or pounding heartbeats.  Dizziness or fainting.  Tiredness or fatigue.  Fever.  Rapid breathing.  Shortness of breath when lying down.  How is this diagnosed? This condition is diagnosed with a medical history, physical exam, and diagnostic tests. During your physical exam, your health care provider will listen for friction while your heart beats (pericardial rub). You may also have tests, including:  Blood work to look for signs of infection and inflammation.  Electrocardiogram (ECG).  Echocardiogram.  CT scan.  MRI.  Culture of pericardial fluid.  A tissue sample (biopsy) of the pericardium.  If tests show that you may have constrictive pericarditis, you may have a procedure (cardiac catheterization) to confirm this diagnosis. How is this treated? Treatment for this condition depends on the cause and type of pericarditis. In most cases, acute pericarditis will clear up on its own within 10 days. Treatment for other types of pericarditis may include:  Medicines, such as: ? NSAIDs for pain and inflammation. ? Steroids to reduce inflammation. ? Colchicine to relieve pain and inflammation.  A procedure to remove fluid using a needle (pericardiocentesis) if pericardial effusion puts pressure on the heart.  Surgery to remove part of the pericardium if constrictive pericarditis   develops.  If another condition is causing your pericarditis, you may need treatment  for that underlying condition. Follow these instructions at home:  Do not use tobacco products, including cigarettes, chewing tobacco, or e-cigarettes. If you need help quitting, ask your health care provider.  Maintain a healthy weight.  Follow an exercise program as told by your health care provider. You may need to limit your exercise until your symptoms go away.  Eat a heart-healthy diet. A registered dietitian can help you to learn about healthy food choices.  Take over-the-counter and prescription medicines only as told by your health care provider. Keep a list of all of your medicines with you at all times. For each medicine, include information about the name, the dosage, how often you take it, and how you take it.  Keep all follow-up visits as told by your health care provider. This is important. Contact a health care provider if:  You continue to have symptoms of pericarditis.  You develop new symptoms of pericarditis.  Your symptoms get worse. Get help right away if:  You have worsening chest pain and difficulty breathing. These symptoms may represent a serious problem that is an emergency. Do not wait to see if the symptoms will go away. Get medical help right away. Call your local emergency services (911 in the [place] Do not drive yourself to the hospital. This information is not intended to replace advice given to you by your health care provider. Make sure you discuss any questions you have with your health care provider. Document Released: 08/23/2000 Document Revised: 08/02/2015 Document Reviewed: 09/09/2014 Elsevier Interactive Patient Education  2018 [employer]  

## 2017-11-23 NOTE — Progress Notes (Signed)
IV and telemetry discontinued. CCMD notified. Discharge instructions reviewed with patient and patient's wife. All questions answered.   K. Starr Choice Kleinsasser, RN 

## 2017-11-24 LAB — CULTURE, GROUP A STREP (THRC)

## 2017-11-24 LAB — ANA: ANA: NEGATIVE

## 2017-11-26 ENCOUNTER — Telehealth (HOSPITAL_COMMUNITY): Payer: Self-pay

## 2017-11-26 NOTE — Telephone Encounter (Signed)
Culture is positive for non group A Strep germ.  This is a finding of uncertain significance; not the typical 'strep throat' germ.  Attempted to reach patient. No answer at this time.  

## 2017-11-27 LAB — CULTURE, BLOOD (ROUTINE X 2)
CULTURE: NO GROWTH
Culture: NO GROWTH
Special Requests: ADEQUATE

## 2017-12-04 ENCOUNTER — Ambulatory Visit: Payer: Self-pay | Admitting: Cardiology

## 2017-12-24 ENCOUNTER — Ambulatory Visit: Payer: Self-pay | Admitting: Cardiology

## 2018-01-21 ENCOUNTER — Encounter: Payer: Self-pay | Admitting: Physician Assistant

## 2018-01-21 ENCOUNTER — Telehealth: Payer: Self-pay | Admitting: Physician Assistant

## 2018-01-21 ENCOUNTER — Ambulatory Visit (INDEPENDENT_AMBULATORY_CARE_PROVIDER_SITE_OTHER): Payer: BLUE CROSS/BLUE SHIELD | Admitting: Physician Assistant

## 2018-01-21 VITALS — BP 130/80 | HR 117 | Ht 72.0 in | Wt 213.8 lb

## 2018-01-21 DIAGNOSIS — Z720 Tobacco use: Secondary | ICD-10-CM | POA: Diagnosis not present

## 2018-01-21 DIAGNOSIS — J438 Other emphysema: Secondary | ICD-10-CM

## 2018-01-21 DIAGNOSIS — Z8679 Personal history of other diseases of the circulatory system: Secondary | ICD-10-CM | POA: Diagnosis not present

## 2018-01-21 DIAGNOSIS — I313 Pericardial effusion (noninflammatory): Secondary | ICD-10-CM | POA: Diagnosis not present

## 2018-01-21 DIAGNOSIS — I3139 Other pericardial effusion (noninflammatory): Secondary | ICD-10-CM

## 2018-01-21 NOTE — Telephone Encounter (Signed)
° ° °  Patient's spouse calling to report patient hs been smoking cigarettes, also reporting patient has had issues taking Colchicine; diarrhea Patient has appt today at 10:30. She wanted to make Bhagat aware. No follow up phone call needed

## 2018-01-21 NOTE — Progress Notes (Signed)
Cardiology Office Note    Date:  01/21/2018   ID:  Wardell Heath, DOB [birth year], MRN [id number]  PCP:  Patient, No Pcp Per  Cardiologist:  Dr. Irish Lack   Chief Complaint: Hospital follow up   History of Present Illness:   Jon Doyle is a 32 y.o. male a PMH of tobacco abuse and pericardiac effusion presents for follow up.   He denies prior cardiac history. No family history of heart disease in mother/father who are alive and healthy. He does reports aunts/uncles who have had MI's. No family history of autoimmune disease that he knows of.  He has never tried to quit in the past but is not opposed to quitting.   Admitted 11/2017 for fever of unknown origin. Found to have a moderate to large pericardial effusion of CTA Chest. Symptoms sound consistent with pericarditis but no clear inciting incident aside from possible sore throat one week prior to fever onset (per note, Culture is positive for non group A Strep germ after discharge). No evidence of tamponade. Trops negative. EKG without ischemic changes. ESR/CRP are elevated. Echo showed normal LVEF; small to moderate, free-flowing pericardial effusion. There was no evidence of  hemodynamic compromise. Patient was started on ibuprofen and colchicine with improvement in symptoms.   Here today for follow up after getting insurance through job. 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 Continues to smoke about 4-5 black & mild's per day. No recurrent chest pain . The patient denies nausea, vomiting, fever, chest pain, palpitations, shortness of breath, orthopnea, PND, dizziness, syncope, cough, congestion, abdominal pain, hematochezia, melena, lower extremity edema.   Past Medical History:  Diagnosis Date  . Normocytic anemia 11/23/2017  . Pericardial effusion 11/23/2017  . SIRS (systemic inflammatory response syndrome) ([facility]) 11/23/2017    No past surgical history on file.  Current Medications:  Prior to Admission medications     Medication Sig Start Date End Date Taking? Authorizing Provider  acetaminophen (TYLENOL) 325 MG tablet Take 2 tablets (650 mg total) by mouth every 6 (six) hours as needed for mild pain (or Fever >/= 101). 11/23/17  Yes Cherene Altes, MD  colchicine 0.6 MG tablet Take 1 tablet (0.6 mg total) by mouth 2 (two) times daily. 11/23/17  Yes Cherene Altes, MD  loratadine (CLARITIN) 10 MG tablet Take 10 mg by mouth daily as needed (for cold-like symptoms or allergies).   Yes [provider]  Pseudoeph-Doxylamine-DM-APAP (NYQUIL MULTI-SYMPTOM PO) Take 30 mLs by mouth every 6 (six) hours as needed (for fever or cold-like synptoms).    Yes [provider]    Allergies:   Patient has no known allergies.   Social History   Socioeconomic History  . Marital status: Married    Spouse name: Not on file  . Number of children: Not on file  . Years of education: Not on file  . Highest education level: Not on file  Occupational History  . Not on file  Social Needs  . Financial resource strain: Not on file  . Food insecurity:    Worry: Not on file    Inability: Not on file  . Transportation needs:    Medical: Not on file    Non-medical: Not on file  Tobacco Use  . Smoking status: Current Every Day Smoker  . Smokeless tobacco: Never Used  . Tobacco comment: black and mild  Substance and Sexual Activity  . Alcohol use: Yes    Comment: social   . Drug use:  Not Currently    Types: Marijuana    Comment: occasionally  . Sexual activity: Not on file  Lifestyle  . Physical activity:    Days per week: Not on file    Minutes per session: Not on file  . Stress: Not on file  Relationships  . Social connections:    Talks on phone: Not on file    Gets together: Not on file    Attends religious service: Not on file    Active member of club or organization: Not on file    Attends meetings of clubs or organizations: Not on file    Relationship status: Not on file  Other Topics  Concern  . Not on file  Social History Narrative  . Not on file     Family History:  As above  ROS:   Please see the history of present illness.    ROS All other systems reviewed and are negative.   PHYSICAL EXAM:   VS:  BP 130/80   Pulse (!) 117   Ht 6' (1.829 m)   Wt 213 lb 12.8 oz (97 kg)   SpO2 97%   BMI 29.00 kg/m    GEN: Well nourished, well developed, in no acute distress  HEENT: normal  Neck: no JVD, carotid bruits, or masses Cardiac: RRR; no murmurs, rubs, or gallops,no edema  Respiratory:  clear to auscultation bilaterally, normal work of breathing GI: soft, nontender, nondistended, + BS MS: no deformity or atrophy  Skin: warm and dry, no rash Neuro:  Alert and Oriented x 3, Strength and sensation are intact Psych: euthymic mood, full affect  Wt Readings from Last 3 Encounters:  01/21/18 213 lb 12.8 oz (97 kg)  11/23/17 225 lb 12 oz (102.4 kg)      Studies/Labs Reviewed:   EKG:  EKG is not ordered today.    Recent Labs: 11/23/2017: ALT 29; BUN 7; Creatinine, Ser 1.10; Hemoglobin 9.9; Platelets 261; Potassium 3.9; Sodium 136; TSH 1.504   Lipid Panel No results found for: CHOL, TRIG, HDL, CHOLHDL, VLDL, LDLCALC, LDLDIRECT  Additional studies/ records that were reviewed today include:   Echocardiogram: 11/23/17 Study Conclusions  - Left ventricle: The cavity size was normal. Systolic function was   normal. The estimated ejection fraction was in the range of 55%   to 60%. Wall motion was normal; there were no regional wall   motion abnormalities. Left ventricular diastolic function   parameters were normal. - Inferior vena cava: The vessel was borderline dilated. The   respirophasic diameter changes were in the normal range (>= 50%),   consistent with normal central venous pressure. - Pericardium, extracardiac: A small to moderate, free-flowing   pericardial effusion was identified circumferential to the heart,   with the largest amount posterior to  the left ventricle. The   fluid had no internal echoes. There was no evidence of   hemodynamic compromise.    ASSESSMENT & PLAN:    1. Pericarditis c/b pericardial effusion without tamponade  - Likely due to strep as culture is positive for non group A Strep germ after discharge -  Echo with normal LVEF as above -  Given asymptomatic current, patient has defer echo. Agree.  - Treated withibuprofen x2 weeks. He will finish colchicine x3 months (stop 02/22/18).  - He will establish care with PCP next month prior to stopping colchicine.   2. Tobacco abuse - Continue to smoke  4-5 black&milds per day. Strongly encouraged cessation.   3. Emphysema  on CTA Chest and benefits of smoking cessation - Will establish care with PCP     Medication Adjustments/Labs and Tests Ordered: Current medicines are reviewed at length with the patient today.  Concerns regarding medicines are outlined above.  Medication changes, Labs and Tests ordered today are listed in the Patient Instructions below. Patient Instructions  Medication Instructions:  Your physician recommends that you continue on your current medications as directed. Please refer to the Current Medication list given to you today.  If you need a refill on your cardiac medications before your next appointment, please call your pharmacy.   Lab work: None ordered  If you have labs (blood work) drawn today and your tests are completely normal, you will receive your results only by: Marland Kitchen MyChart Message (if you have MyChart) OR . A paper copy in the mail If you have any lab test that is abnormal or we need to change your treatment, we will call you to review the results.  Testing/Procedures: None orderd  Follow-Up Your physician recommends that you schedule a follow-up appointment in: AS NEEDED     Jarrett Soho, PA  01/21/2018 11:09 AM    [address], [place], [place]   [id number] Phone: [phone]; Fax: [phone]

## 2018-01-21 NOTE — Telephone Encounter (Signed)
Fwd to [employer], PA-C

## 2018-01-21 NOTE — Patient Instructions (Signed)
Medication Instructions:  Your physician recommends that you continue on your current medications as directed. Please refer to the Current Medication list given to you today.  If you need a refill on your cardiac medications before your next appointment, please call your pharmacy.   Lab work: None ordered  If you have labs (blood work) drawn today and your tests are completely normal, you will receive your results only by: Marland Kitchen MyChart Message (if you have MyChart) OR . A paper copy in the mail If you have any lab test that is abnormal or we need to change your treatment, we will call you to review the results.  Testing/Procedures: None orderd  Follow-Up Your physician recommends that you schedule a follow-up appointment in: AS NEEDED

## 2018-09-23 ENCOUNTER — Other Ambulatory Visit: Payer: Self-pay

## 2018-09-23 ENCOUNTER — Emergency Department (HOSPITAL_COMMUNITY)
Admission: EM | Admit: 2018-09-23 | Discharge: 2018-09-23 | Disposition: A | Discharge status: Home / Self Care | Payer: BC Managed Care – PPO | Attending: Emergency Medicine | Admitting: Emergency Medicine

## 2018-09-23 ENCOUNTER — Encounter (HOSPITAL_COMMUNITY): Payer: Self-pay

## 2018-09-23 DIAGNOSIS — Z0489 Encounter for examination and observation for other specified reasons: Secondary | ICD-10-CM | POA: Diagnosis present

## 2018-09-23 DIAGNOSIS — F439 Reaction to severe stress, unspecified: Secondary | ICD-10-CM | POA: Diagnosis not present

## 2018-09-23 DIAGNOSIS — F17200 Nicotine dependence, unspecified, uncomplicated: Secondary | ICD-10-CM | POA: Insufficient documentation

## 2018-09-23 DIAGNOSIS — R4189 Other symptoms and signs involving cognitive functions and awareness: Secondary | ICD-10-CM | POA: Insufficient documentation

## 2018-09-23 NOTE — ED Provider Notes (Signed)
[facility] DEPT Provider Note   CSN: [id number] Arrival date & time: 09/23/18  1503     History   Chief Complaint Chief Complaint  Patient presents with  . Needs evaluation    Found Unresponsive     HPI Delois Tolbert is a 33 y.o. male.     HPI Presents after being found in his car, by bystanders, and being brought here for evaluation. Patient self states that he typically, neurologic rests, smokes a black mild, watches videos in his car and rests. Today, he states that he was doing this typical activity, when he was suddenly awakened, startled, by bystanders who are inquiring as to his wellbeing. Patient states that he currently has no complaints, feels generally well, denies pain, lightheadedness, is in his usual state of health. He does have a history of abdominal issues, but not currently. He is also planning to start a GI cleansing regimen with his wife, and her behest. When asked how we are going to be of assistance to him today, he replies with uncertainty, stating that he feels okay.   Past Medical History:  Diagnosis Date  . Normocytic anemia 11/23/2017  . Pericardial effusion 11/23/2017  . SIRS (systemic inflammatory response syndrome) ([facility]) 11/23/2017    Patient Active Problem List   Diagnosis Date Noted  . Pericardial effusion 11/23/2017  . Normocytic anemia 11/23/2017    History reviewed. No pertinent surgical history.      Home Medications    Prior to Admission medications   Medication Sig Start Date End Date Taking? Authorizing Provider  acetaminophen (TYLENOL) 325 MG tablet Take 2 tablets (650 mg total) by mouth every 6 (six) hours as needed for mild pain (or Fever >/= 101). 11/23/17   Cherene Altes, MD  colchicine 0.6 MG tablet Take 1 tablet (0.6 mg total) by mouth 2 (two) times daily. 11/23/17   Cherene Altes, MD  loratadine (CLARITIN) 10 MG tablet Take 10 mg by mouth daily as needed (for cold-like symptoms  or allergies).    [provider]  Pseudoeph-Doxylamine-DM-APAP (NYQUIL MULTI-SYMPTOM PO) Take 30 mLs by mouth every 6 (six) hours as needed (for fever or cold-like synptoms).     [provider]    Family History No family history on file.  Social History Social History   Tobacco Use  . Smoking status: Current Every Day Smoker  . Smokeless tobacco: Never Used  . Tobacco comment: black and mild  Substance Use Topics  . Alcohol use: Yes    Comment: social   . Drug use: Not Currently    Types: Marijuana    Comment: occasionally     Allergies   Patient has no known allergies.   Review of Systems Review of Systems  Constitutional:       Per HPI, otherwise negative  HENT:       Per HPI, otherwise negative  Respiratory:       Per HPI, otherwise negative  Cardiovascular:       Per HPI, otherwise negative  Gastrointestinal: Negative for vomiting.  Endocrine:       Negative aside from HPI  Genitourinary:       Neg aside from HPI   Musculoskeletal:       Per HPI, otherwise negative  Skin: Negative.   Neurological: Negative for syncope.     Physical Exam Updated Vital Signs BP 124/85 (BP Location: Left Arm)   Pulse 88   Temp 98.7 F (37.1 C) (Oral)  Resp 17   SpO2 97%   Physical Exam Vitals signs and nursing note reviewed.  Constitutional:      General: He is not in acute distress.    Appearance: He is well-developed.  HENT:     Head: Normocephalic and atraumatic.  Eyes:     Conjunctiva/sclera: Conjunctivae normal.  Cardiovascular:     Rate and Rhythm: Normal rate and regular rhythm.  Pulmonary:     Effort: Pulmonary effort is normal. No respiratory distress.     Breath sounds: No stridor.  Abdominal:     General: There is no distension.  Skin:    General: Skin is warm and dry.  Neurological:     Mental Status: He is alert and oriented to person, place, and time.      ED Treatments / Results   Procedures Procedures  (including critical care time)  Medications Ordered in ED Medications - No data to display   Initial Impression / Assessment and Plan / ED Course  I have reviewed the triage vital signs and the nursing notes.  Pertinent labs & imaging results that were available during my care of the patient were reviewed by me and considered in my medical decision making (see chart for details).  Patient presents after being found in his car, seemingly startled after being awakened from resting.  He does acknowledge increased recent stress, but otherwise is in his usual state of health. He has no physical complaints, hemodynamically unremarkable, has a reassuring physical exam, and that we discussed utility of labs, there is little indication to obtain them given his denial of complaints, and after mentioned reassuring findings. Patient encouraged to return should he develop new concerns, or follow-up with primary care as needed.  Final Clinical Impressions(s) / ED Diagnoses   Final diagnoses:  Stress     Gerhard MunchLockwood, Shateria Paternostro, MD 09/23/18 1550

## 2018-09-23 NOTE — ED Notes (Signed)
Patient states he was sleeping and not unresponsive. Patient states he was watching [employer].

## 2018-09-23 NOTE — ED Notes (Addendum)
Patient ambulated to restroom for UA sample. No assistance needed.

## 2018-09-23 NOTE — ED Triage Notes (Addendum)
Patient arrived via [facility]  [employer] found patient unresponsive sitting in his car at [employer].   When EMS arrived patient was sluggish, slow to respond, smoking a back in mild, A/Ox4, denying using drugs, and states he has anxiety, and claims he was just sleeping.   EMS asked if he wanted to come to ED to get a check up and patient said yes.   Patient states he has had some extra stress lately.    Hx. anxiety  Patient ambulated to bathroom with no assistance and no problems.

## 2018-09-23 NOTE — Discharge Instructions (Addendum)
As discussed, your evaluation today has been largely reassuring.  But, it is important that you monitor your condition carefully, and do not hesitate to return to the ED if you develop new, or concerning changes in your condition. ? ?Otherwise, please follow-up with your physician for appropriate ongoing care. ? ?

## 2018-10-02 ENCOUNTER — Other Ambulatory Visit (HOSPITAL_BASED_OUTPATIENT_CLINIC_OR_DEPARTMENT_OTHER): Payer: Self-pay | Admitting: Family Medicine

## 2018-10-02 DIAGNOSIS — R1012 Left upper quadrant pain: Secondary | ICD-10-CM

## 2018-10-03 ENCOUNTER — Encounter (HOSPITAL_BASED_OUTPATIENT_CLINIC_OR_DEPARTMENT_OTHER): Payer: Self-pay

## 2018-10-03 ENCOUNTER — Other Ambulatory Visit: Payer: Self-pay

## 2018-10-03 ENCOUNTER — Ambulatory Visit (HOSPITAL_BASED_OUTPATIENT_CLINIC_OR_DEPARTMENT_OTHER)
Admission: RE | Admit: 2018-10-03 | Discharge: 2018-10-03 | Disposition: A | Discharge status: Home / Self Care | Payer: BC Managed Care – PPO | Source: Ambulatory Visit | Attending: Family Medicine | Admitting: Family Medicine

## 2018-10-03 DIAGNOSIS — R1012 Left upper quadrant pain: Secondary | ICD-10-CM

## 2018-10-03 MED ORDER — IOHEXOL 300 MG/ML  SOLN
100.0000 mL | Freq: Once | INTRAMUSCULAR | Status: AC | PRN
  Administered 2018-10-03: 100 mL via INTRAVENOUS

## 2021-01-28 IMAGING — CT CT ABDOMEN AND PELVIS WITH CONTRAST
2 of 4 series · 17 of 46 positions shown, 19 images · IV contrast (APPLIED)
Comparison: None.

CLINICAL DATA: 32-year-old male with LEFT UPPER abdominal pain and
diarrhea for 2 weeks.

EXAM:
CT ABDOMEN AND PELVIS WITH CONTRAST
TECHNIQUE: Multidetector CT imaging of the abdomen and pelvis was performed
using the standard protocol following bolus administration of
intravenous contrast.
CONTRAST:  100mL OMNIPAQUE IOHEXOL 300 MG/ML  SOLN

[Series 2: axial st · axial · 0.92mm/px · z∈[-709,-264]mm · 14 of 99 slices shown, 16 images]
[im 5/99  soft-tissue]
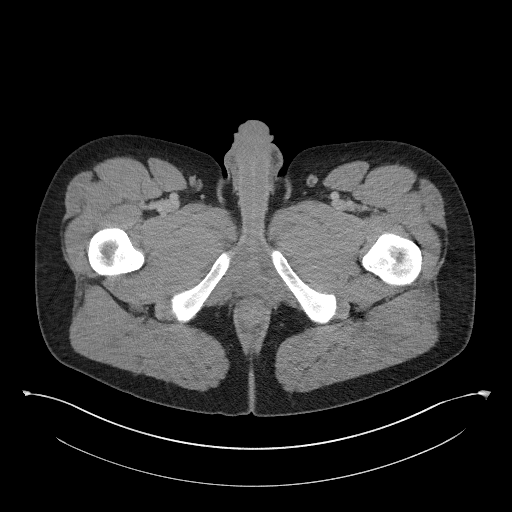
[im 5/99  bone]
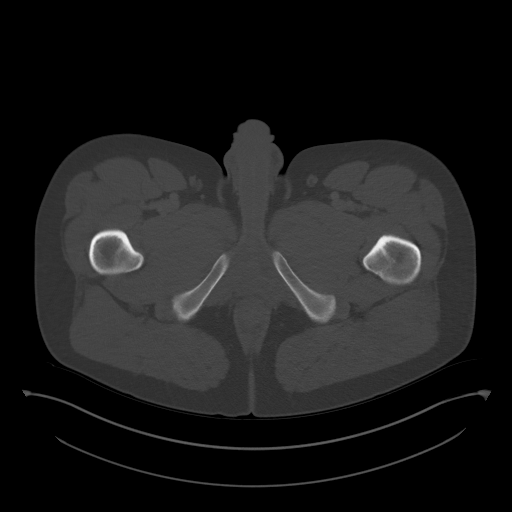
[im 13/99  soft-tissue]
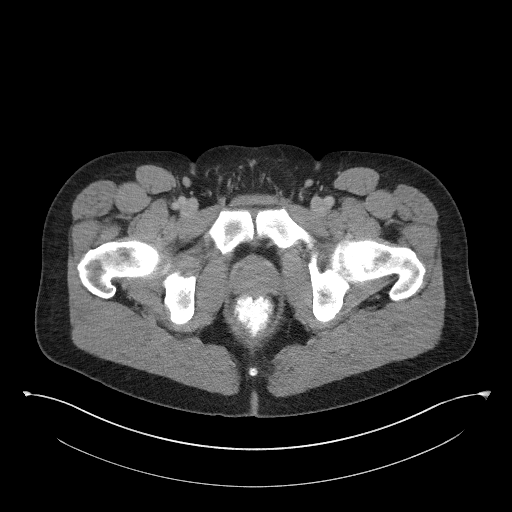
[im 18/99  soft-tissue]
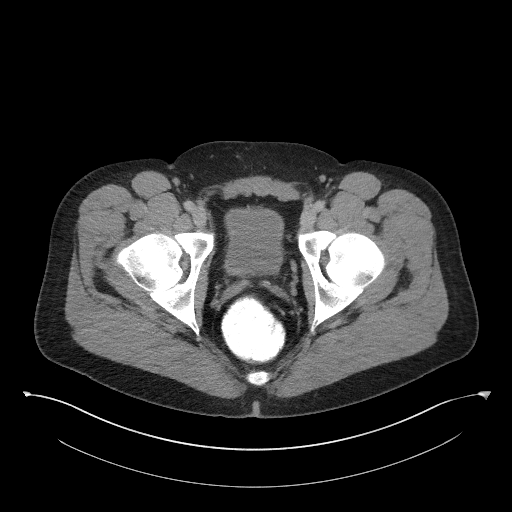
[im 26/99  soft-tissue]
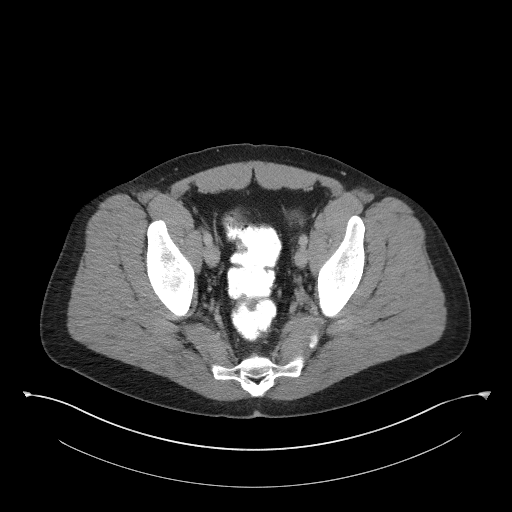
[im 35/99  soft-tissue]
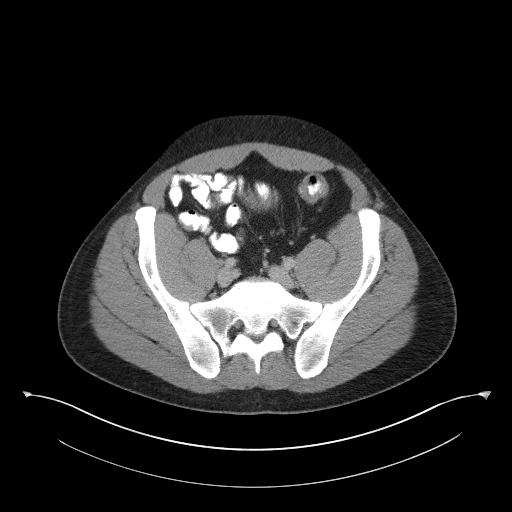
[im 39/99  soft-tissue]
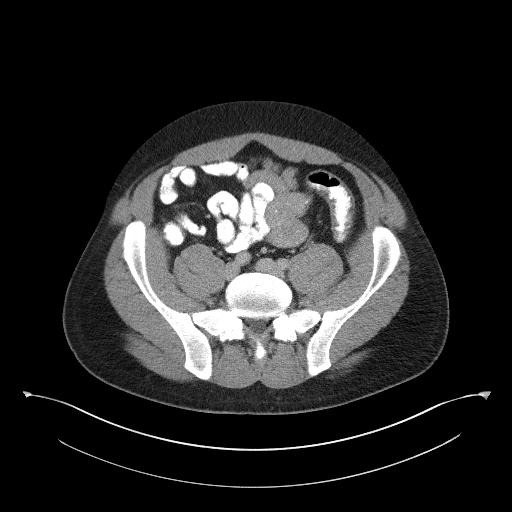
[im 47/99  soft-tissue]
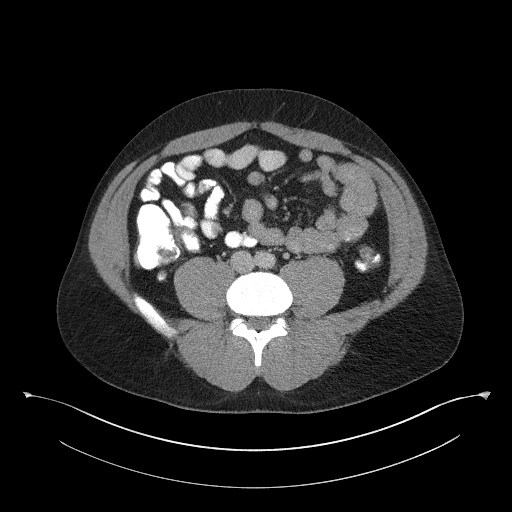
[im 52/99  soft-tissue]
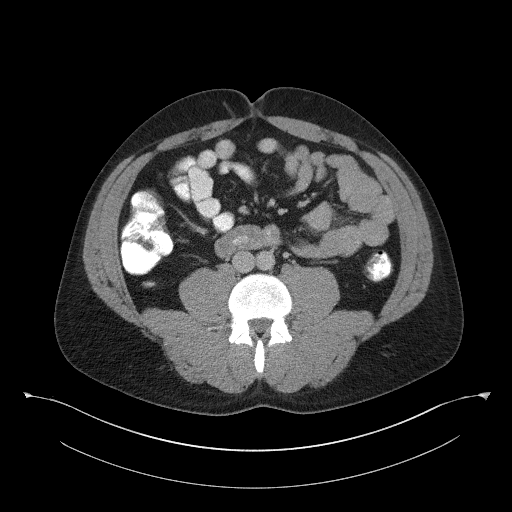
[im 60/99  soft-tissue]
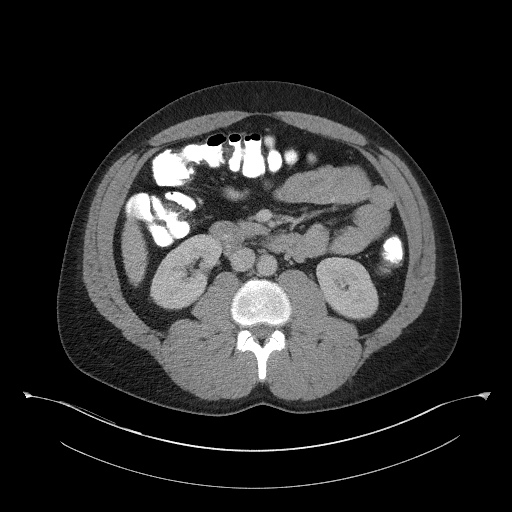
[im 60/99  bone]
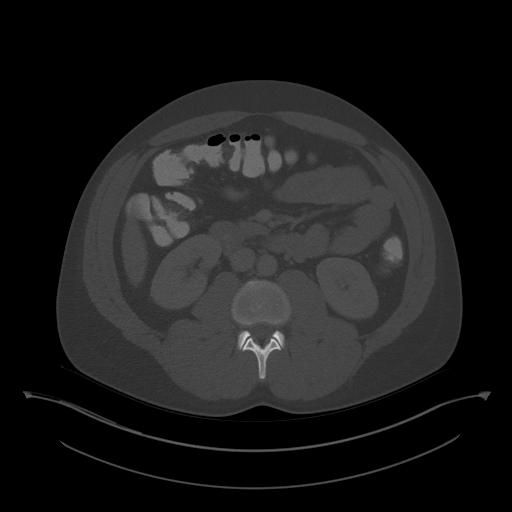
[im 64/99  soft-tissue]
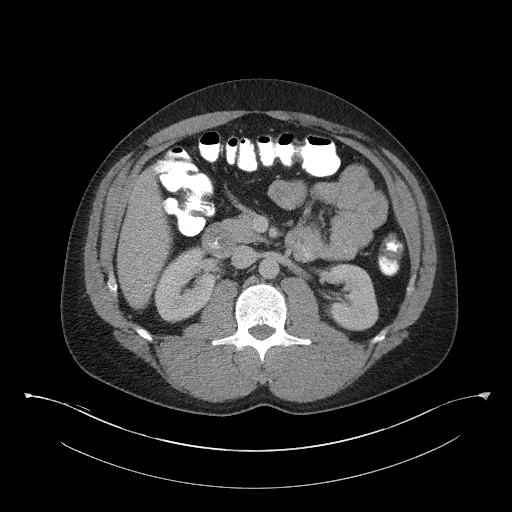
[im 73/99  soft-tissue]
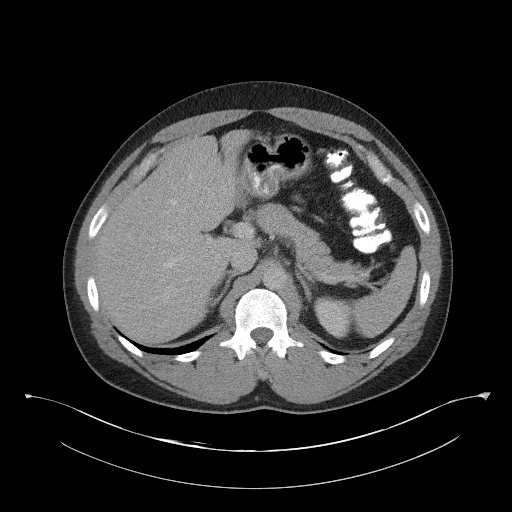
[im 81/99  soft-tissue]
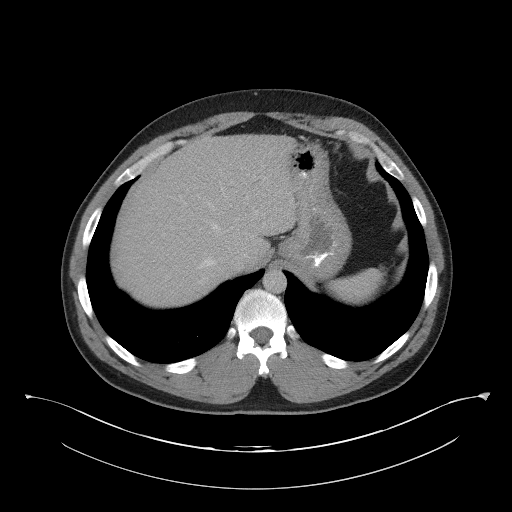
[im 86/99  soft-tissue]
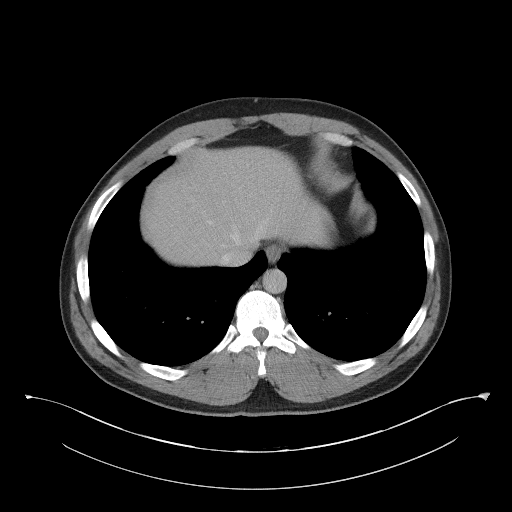
[im 94/99  soft-tissue]
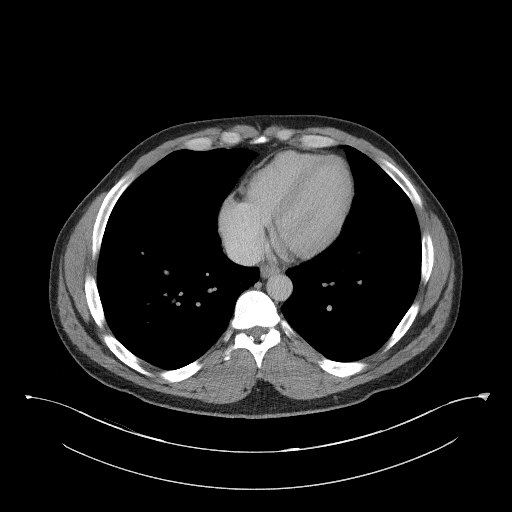

[Series 5: coronal st · coronal · 0.78mm/px · 3 of 106 slices shown]
[im 36/106  soft-tissue]
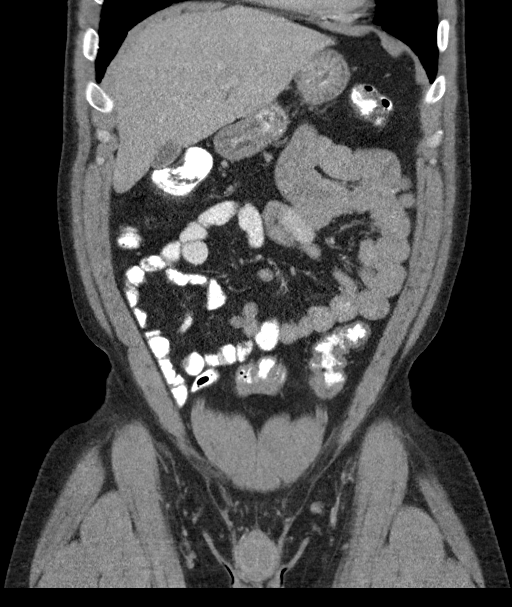
[im 47/106  soft-tissue]
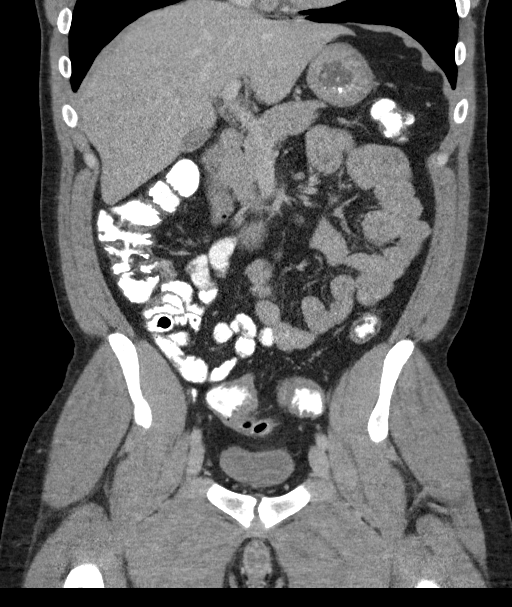
[im 59/106  soft-tissue]
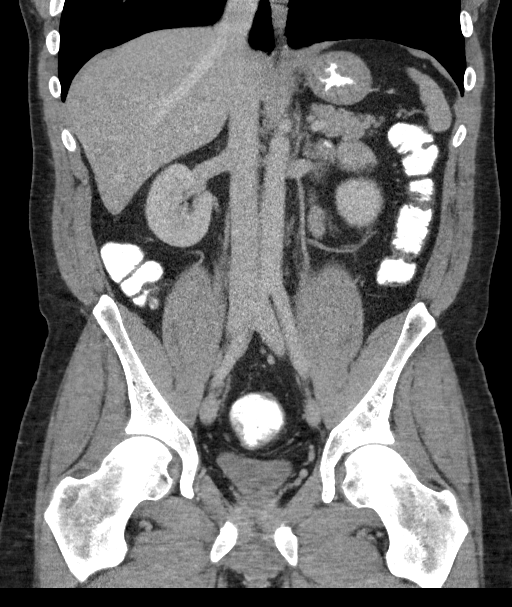

[17 of 46 positions shown; findings below may reference images not displayed]

FINDINGS: Lower chest: Unremarkable

Hepatobiliary: The liver and gallbladder are unremarkable. No
biliary dilatation.

Pancreas: Unremarkable

Spleen: Unremarkable

Adrenals/Urinary Tract: The kidneys, adrenal glands and bladder are
unremarkable.

Stomach/Bowel: Stomach is within normal limits. Appendix appears
normal. No evidence of bowel wall thickening, distention, or
inflammatory changes.

Vascular/Lymphatic: No significant vascular findings are present. No
enlarged abdominal or pelvic lymph nodes.

Reproductive: Prostate is unremarkable.

Other: No ascites, focal collection or pneumoperitoneum.

Musculoskeletal: No acute or significant bony abnormalities.
IMPRESSION: Unremarkable contrast CT of the abdomen and pelvis. No CT findings
to explain this patient's LEFT UPPER abdominal pain.

## 2022-11-06 ENCOUNTER — Ambulatory Visit
Admission: EM | Admit: 2022-11-06 | Discharge: 2022-11-06 | Disposition: A | Discharge status: Home / Self Care | Payer: BLUE CROSS/BLUE SHIELD | Attending: Internal Medicine | Admitting: Internal Medicine

## 2022-11-06 DIAGNOSIS — U071 COVID-19: Secondary | ICD-10-CM | POA: Diagnosis present

## 2022-11-06 DIAGNOSIS — Z720 Tobacco use: Secondary | ICD-10-CM | POA: Diagnosis present

## 2022-11-06 MED ORDER — PROMETHAZINE-DM 6.25-15 MG/5ML PO SYRP
5.0000 mL | ORAL_SOLUTION | Freq: Three times a day (TID) | ORAL | 0 refills | Status: AC | PRN
Start: 2022-11-06 — End: ?

## 2022-11-06 MED ORDER — CETIRIZINE HCL 10 MG PO TABS
10.0000 mg | ORAL_TABLET | Freq: Every day | ORAL | 0 refills | Status: AC
Start: 2022-11-06 — End: ?

## 2022-11-06 MED ORDER — PREDNISONE 20 MG PO TABS: ORAL_TABLET | ORAL | 0 refills | Status: AC

## 2022-11-06 MED ORDER — ACETAMINOPHEN 325 MG PO TABS
650.0000 mg | ORAL_TABLET | Freq: Four times a day (QID) | ORAL | 0 refills | Status: AC | PRN
Start: 2022-11-06 — End: ?

## 2022-11-06 NOTE — ED Triage Notes (Signed)
Pt reports +covid home test-sx started 8/22-NAD-steady gait

## 2022-11-06 NOTE — ED Provider Notes (Signed)
[facility]  Note:  This document was prepared using Conservation officer, historic buildings and may include unintentional dictation errors.  MRN: [id number] DOB: 1985/08/11  Subjective:   Jon Doyle is a 37 y.o. male presenting for 4-day history of acute onset persistent coughing, malaise, body aches.  Denies chest pain, shortness of breath or wheezing.  Patient did do a home test for COVID-19 and was positive on 11/02/2022 the day his symptoms started.  Requires documentation for his work.  Risk factors include vaping daily, using marijuana multiple times weekly.  Has a history of pericardial effusion, SIRS from 2019 and is without sequela.  No current facility-administered medications for this encounter.  Current Outpatient Medications:    acetaminophen (TYLENOL) 325 MG tablet, Take 2 tablets (650 mg total) by mouth every 6 (six) hours as needed for mild pain (or Fever >/= 101)., Disp: , Rfl:    colchicine 0.6 MG tablet, Take 1 tablet (0.6 mg total) by mouth 2 (two) times daily., Disp: 60 tablet, Rfl: 2   loratadine (CLARITIN) 10 MG tablet, Take 10 mg by mouth daily as needed (for cold-like symptoms or allergies)., Disp: , Rfl:    Pseudoeph-Doxylamine-DM-APAP (NYQUIL MULTI-SYMPTOM PO), Take 30 mLs by mouth every 6 (six) hours as needed (for fever or cold-like synptoms). , Disp: , Rfl:    No Known Allergies  Past Medical History:  Diagnosis Date   Normocytic anemia 11/23/2017   Pericardial effusion 11/23/2017   SIRS (systemic inflammatory response syndrome) ([facility]) 11/23/2017     History reviewed. No pertinent surgical history.  No family history on file.  Social History   Tobacco Use   Smoking status: Former    Types: Cigars   Smokeless tobacco: Never   Tobacco comments:    black and mild  Vaping Use   Vaping status: Every Day  Substance Use Topics   Alcohol use: Not Currently   Drug use: Yes    Types: Marijuana    ROS   Objective:    Vitals: BP (!) 124/90 (BP Location: Right Arm)   Pulse 89   Temp (!) 100.4 F (38 C) (Oral)   Resp 20   SpO2 98%   Physical Exam Constitutional:      General: He is not in acute distress.    Appearance: Normal appearance. He is well-developed. He is not ill-appearing, toxic-appearing or diaphoretic.  HENT:     Head: Normocephalic and atraumatic.     Right Ear: External ear normal.     Left Ear: External ear normal.     Nose: Nose normal.     Mouth/Throat:     Mouth: Mucous membranes are moist.  Eyes:     General: No scleral icterus.       Right eye: No discharge.        Left eye: No discharge.     Extraocular Movements: Extraocular movements intact.  Cardiovascular:     Rate and Rhythm: Normal rate and regular rhythm.     Heart sounds: Normal heart sounds. No murmur heard.    No friction rub. No gallop.  Pulmonary:     Effort: Pulmonary effort is normal. No respiratory distress.     Breath sounds: Normal breath sounds. No stridor. No wheezing, rhonchi or rales.  Neurological:     Mental Status: He is alert and oriented to person, place, and time.  Psychiatric:        Mood and Affect: Mood normal.  Behavior: Behavior normal.        Thought Content: Thought content normal.     Assessment and Plan :   PDMP not reviewed this encounter.  1. Clinical diagnosis of COVID-19   2. Vapes nicotine containing substance    Offered imaging but patient declined.  Will get a confirmation COVID test for his employer.  Given his history of pericardial effusion, SIRS I did recommend Paxlovid but the patient declined.  Will instead use an oral prednisone course in the context of his respiratory symptoms, smoking.  Counseled patient on potential for adverse effects with medications prescribed/recommended today, ER and return-to-clinic precautions discussed, patient verbalized understanding.    Wallis Bamberg, [place] 11/06/22 [phone]

## 2022-11-06 NOTE — Discharge Instructions (Addendum)
We will manage this COVID infection with supportive care. For sore throat or cough try using a honey-based tea. Use 3 teaspoons of honey with juice squeezed from half lemon. Place shaved pieces of ginger into 1/2-1 cup of water and warm over stove top. Then mix the ingredients and repeat every 4 hours as needed. Please take Tylenol 650mg  once every 6 hours for fevers, aches and pains. Hydrate very well with at least 2 liters (64 ounces) of water. Eat light meals such as soups (chicken and noodles, chicken wild rice, vegetable).  Do not eat any foods that you are allergic to.  Start an antihistamine like Zyrtec (10mg  daily) for postnasal drainage, sinus congestion.  You can take this together with prednisone for 5 days. Use cough syrup as needed.

## 2022-11-07 LAB — SARS CORONAVIRUS 2 (TAT 6-24 HRS): SARS Coronavirus 2: POSITIVE — AB
